# Patient Record
Sex: Female | Born: 1975 | Race: White | Hispanic: No | Marital: Married | Smoking: Never smoker
Health system: Southern US, Community
[De-identification: ages and names within clinical notes are randomized; demographics above are authoritative.]

## PROBLEM LIST (undated history)

## (undated) DIAGNOSIS — O139 Gestational [pregnancy-induced] hypertension without significant proteinuria, unspecified trimester: Secondary | ICD-10-CM

## (undated) HISTORY — DX: Gestational (pregnancy-induced) hypertension without significant proteinuria, unspecified trimester: O13.9

---

## 2009-10-08 ENCOUNTER — Emergency Department (HOSPITAL_BASED_OUTPATIENT_CLINIC_OR_DEPARTMENT_OTHER): Admission: EM | Admit: 2009-10-08 | Discharge: 2009-10-09 | Payer: Self-pay | Admitting: Emergency Medicine

## 2011-01-15 LAB — COMPREHENSIVE METABOLIC PANEL
Alkaline Phosphatase: 52 U/L (ref 39–117)
BUN: 7 mg/dL (ref 6–23)
Calcium: 8.8 mg/dL (ref 8.4–10.5)
Creatinine, Ser: 0.7 mg/dL (ref 0.4–1.2)
Glucose, Bld: 96 mg/dL (ref 70–99)
Total Protein: 7.2 g/dL (ref 6.0–8.3)

## 2011-01-15 LAB — CLOSTRIDIUM DIFFICILE EIA: C difficile Toxins A+B, EIA: NEGATIVE

## 2011-01-15 LAB — CBC
HCT: 39.8 % (ref 36.0–46.0)
Hemoglobin: 13.5 g/dL (ref 12.0–15.0)
MCHC: 33.9 g/dL (ref 30.0–36.0)
MCV: 93.4 fL (ref 78.0–100.0)
RDW: 11.7 % (ref 11.5–15.5)

## 2011-01-15 LAB — URINALYSIS, ROUTINE W REFLEX MICROSCOPIC
Bilirubin Urine: NEGATIVE
Glucose, UA: NEGATIVE mg/dL
Ketones, ur: NEGATIVE mg/dL
Protein, ur: NEGATIVE mg/dL
pH: 5.5 (ref 5.0–8.0)

## 2011-01-15 LAB — DIFFERENTIAL
Basophils Relative: 0 % (ref 0–1)
Lymphocytes Relative: 31 % (ref 12–46)
Lymphs Abs: 1.5 10*3/uL (ref 0.7–4.0)
Monocytes Relative: 14 % — ABNORMAL HIGH (ref 3–12)
Neutro Abs: 2.6 10*3/uL (ref 1.7–7.7)
Neutrophils Relative %: 53 % (ref 43–77)

## 2011-01-15 LAB — OVA AND PARASITE EXAMINATION: Ova and parasites: NONE SEEN

## 2011-01-15 LAB — STOOL CULTURE

## 2011-10-16 DIAGNOSIS — O1490 Unspecified pre-eclampsia, unspecified trimester: Secondary | ICD-10-CM

## 2015-11-10 ENCOUNTER — Ambulatory Visit (INDEPENDENT_AMBULATORY_CARE_PROVIDER_SITE_OTHER): Payer: Managed Care, Other (non HMO) | Admitting: Obstetrics & Gynecology

## 2015-11-10 ENCOUNTER — Encounter: Payer: Self-pay | Admitting: Obstetrics & Gynecology

## 2015-11-10 VITALS — BP 115/69 | HR 68 | Ht 67.0 in | Wt 197.0 lb

## 2015-11-10 DIAGNOSIS — O09291 Supervision of pregnancy with other poor reproductive or obstetric history, first trimester: Secondary | ICD-10-CM

## 2015-11-10 DIAGNOSIS — O09521 Supervision of elderly multigravida, first trimester: Secondary | ICD-10-CM

## 2015-11-10 DIAGNOSIS — O09529 Supervision of elderly multigravida, unspecified trimester: Secondary | ICD-10-CM | POA: Insufficient documentation

## 2015-11-10 DIAGNOSIS — Z36 Encounter for antenatal screening of mother: Secondary | ICD-10-CM

## 2015-11-10 DIAGNOSIS — O09299 Supervision of pregnancy with other poor reproductive or obstetric history, unspecified trimester: Secondary | ICD-10-CM | POA: Insufficient documentation

## 2015-11-10 NOTE — Progress Notes (Signed)
   Subjective:    Savannah Nixon is a [id number] [redacted]w[redacted]d being seen today for her first obstetrical visit.  Her obstetrical history is significant for advanced maternal age, large for gestational age and pre-eclampsia. Patient does intend to breast feed. Pregnancy history fully reviewed.  Patient reports mild nausea.  Filed Vitals:   11/10/15 1321 11/10/15 1324  BP: 115/69   Pulse: 68   Height:   (1.702 m)  Weight: 197 lb (89.359 kg)     HISTORY: OB History  Gravida Para Term Preterm AB SAB TAB Ectopic Multiple Living  # Outcome Date GA Lbr Len/2nd Weight Sex Delivery Anes PTL Lv  4 Current           3 SAB           2 Term  [redacted]w[redacted]d  9 lb 9 oz (4.338 kg)  CS-Classical     1 Preterm  [redacted]w[redacted]d  4 lb 4 oz (1.928 kg)  CS-Classical        Past Medical History  Diagnosis Date  . PIH (pregnancy induced hypertension)    Past Surgical History  Procedure Laterality Date  . Cesarean section     Family History  Problem Relation Age of Onset  . Hypertension Father   . Osteoarthritis Mother   . Atrial fibrillation Father      Exam    Uterus:     Pelvic Exam:    Perineum: No Hemorrhoids   Vulva: normal   Vagina:  normal mucosa, normal discharge   pH: n/a   Cervix: no cervical motion tenderness and no lesions   Adnexa: normal adnexa   Bony Pelvis: gynecoid  System: Breast:  normal appearance, no masses or tenderness   Skin: normal coloration and turgor, no rashes    Neurologic: oriented, normal mood   Extremities: no deformities   HEENT extra ocular movement intact, sclera clear, anicteric, oropharynx clear, no lesions, neck supple with midline trachea and thyroid without masses   Mouth/Teeth mucous membranes moist, pharynx normal without lesions and dental hygiene good   Neck supple and no masses   Cardiovascular: regular rate and rhythm   Respiratory:  appears well, vitals normal, no respiratory distress, acyanotic, normal RR, chest clear, no wheezing,  crepitations, rhonchi, normal symmetric air entry   Abdomen: soft, non-tender; bowel sounds normal; no masses,  no organomegaly   Urinary: urethral meatus normal      Assessment:    Pregnancy: A5W0981 There are no active problems to display for this patient.       Plan:     Initial labs drawn. Prenatal vitamins. Problem list reviewed and updated. Genetic Screening discussed Harmony at next visit  Ultrasound discussed; fetal survey: requested.  Follow up in 4 weeks.  History of severe preeclampsia needing delivery at 34 weeks with first pregnancy and post partum preeclampsia with 2nd pregnancy. Baseline protein creatinine ration and labs Baby ASA at 12 weeks (Please tell pt to take at next visit) Will need rpt c/s and BTL at 39 weeks  Will consider extreme AMA since she is due on her 40th b day. Follow protocol.  Pt reports pap nml 18 months ago with no history of abnml pap.  Will requet records.  RTC 4 weeks  Coreen Shippee H. 11/10/2015

## 2015-11-10 NOTE — Progress Notes (Signed)
Bedside U/S shows IUP with FHT of 166 BPM and CRL is 14.57mm  GA is [redacted]w[redacted]d.  Last pap about 18 months ago, never had an abnormal.  She is Facilities manager in ED @ [employer]

## 2015-11-11 LAB — OBSTETRIC PANEL
Antibody Screen: NEGATIVE
BASOS PCT: 0 % (ref 0–1)
Basophils Absolute: 0 10*3/uL (ref 0.0–0.1)
EOS ABS: 0.1 10*3/uL (ref 0.0–0.7)
Eosinophils Relative: 1 % (ref 0–5)
HEMATOCRIT: 40.4 % (ref 36.0–46.0)
HEMOGLOBIN: 13.2 g/dL (ref 12.0–15.0)
Hepatitis B Surface Ag: NEGATIVE
Lymphocytes Relative: 30 % (ref 12–46)
Lymphs Abs: 3.4 10*3/uL (ref 0.7–4.0)
MCH: 30.1 pg (ref 26.0–34.0)
MCHC: 32.7 g/dL (ref 30.0–36.0)
MCV: 92.2 fL (ref 78.0–100.0)
MONOS PCT: 5 % (ref 3–12)
MPV: 10.2 fL (ref 8.6–12.4)
Monocytes Absolute: 0.6 10*3/uL (ref 0.1–1.0)
NEUTROS ABS: 7.2 10*3/uL (ref 1.7–7.7)
NEUTROS PCT: 64 % (ref 43–77)
Platelets: 318 10*3/uL (ref 150–400)
RBC: 4.38 MIL/uL (ref 3.87–5.11)
RDW: 12.6 % (ref 11.5–15.5)
RH TYPE: POSITIVE
Rubella: 4.25 Index — ABNORMAL HIGH (ref <0.90)
WBC: 11.3 10*3/uL — AB (ref 4.0–10.5)

## 2015-11-11 LAB — COMPREHENSIVE METABOLIC PANEL
ALBUMIN: 4.3 g/dL (ref 3.6–5.1)
ALK PHOS: 43 U/L (ref 33–115)
ALT: 12 U/L (ref 6–29)
AST: 14 U/L (ref 10–30)
BILIRUBIN TOTAL: 0.3 mg/dL (ref 0.2–1.2)
BUN: 10 mg/dL (ref 7–25)
CALCIUM: 9.5 mg/dL (ref 8.6–10.2)
CO2: 22 mmol/L (ref 20–31)
Chloride: 104 mmol/L (ref 98–110)
Creat: 0.71 mg/dL (ref 0.50–1.10)
GLUCOSE: 73 mg/dL (ref 65–99)
POTASSIUM: 3.8 mmol/L (ref 3.5–5.3)
Sodium: 136 mmol/L (ref 135–146)
TOTAL PROTEIN: 6.9 g/dL (ref 6.1–8.1)

## 2015-11-11 LAB — PROTEIN / CREATININE RATIO, URINE
Creatinine, Urine: 50 mg/dL (ref 20–320)
Total Protein, Urine: <4 mg/dL — ABNORMAL LOW (ref 5–24)

## 2015-11-11 LAB — HIV ANTIBODY (ROUTINE TESTING W REFLEX): HIV: NONREACTIVE

## 2015-11-11 LAB — GC/CHLAMYDIA PROBE AMP
CT Probe RNA: NOT DETECTED
GC PROBE AMP APTIMA: NOT DETECTED

## 2015-11-12 LAB — URINE CULTURE

## 2015-12-08 ENCOUNTER — Ambulatory Visit (INDEPENDENT_AMBULATORY_CARE_PROVIDER_SITE_OTHER): Payer: Managed Care, Other (non HMO) | Admitting: Obstetrics & Gynecology

## 2015-12-08 VITALS — BP 120/69 | HR 68 | Wt 201.0 lb

## 2015-12-08 DIAGNOSIS — O09291 Supervision of pregnancy with other poor reproductive or obstetric history, first trimester: Secondary | ICD-10-CM

## 2015-12-08 DIAGNOSIS — Z36 Encounter for antenatal screening of mother: Secondary | ICD-10-CM

## 2015-12-08 DIAGNOSIS — O09521 Supervision of elderly multigravida, first trimester: Secondary | ICD-10-CM

## 2015-12-08 DIAGNOSIS — O34219 Maternal care for unspecified type scar from previous cesarean delivery: Secondary | ICD-10-CM

## 2015-12-08 MED ORDER — ASPIRIN 81 MG PO CHEW: 81.0000 mg | CHEWABLE_TABLET | Freq: Every day | ORAL | Status: DC

## 2015-12-08 NOTE — Progress Notes (Signed)
Subjective:  Savannah Nixon is a 40 y.o. MW [id number] at [redacted]w[redacted]d being seen today for ongoing prenatal care.  She is currently monitored for the following issues for this high-risk pregnancy and has AMA (advanced maternal age) multigravida 35+; Supervision of elderly multigravida, antepartum; History of pre-eclampsia in prior pregnancy, currently pregnant; and Previous cesarean delivery, antepartum condition or complication on her problem list.  Patient reports no complaints.  Contractions: Not present. Vag. Bleeding: None.  Movement: Absent. Denies leaking of fluid.   The following portions of the patient's history were reviewed and updated as appropriate: allergies, current medications, past family history, past medical history, past social history, past surgical history and problem list. Problem list updated.  Objective:   Filed Vitals:   12/08/15 1600  BP: 120/69  Pulse: 68  Weight: 201 lb (91.173 kg)    Fetal Status:     Movement: Absent     General:  Alert, oriented and cooperative. Patient is in no acute distress.  Skin: Skin is warm and dry. No rash noted.   Cardiovascular: Normal heart rate noted  Respiratory: Normal respiratory effort, no problems with respiration noted  Abdomen: Soft, gravid, appropriate for gestational age. Pain/Pressure: Absent     Pelvic: Vag. Bleeding: None Vag D/C Character: Thin   Cervical exam deferred        Extremities: Normal range of motion.     Mental Status: Normal mood and affect. Normal behavior. Normal judgment and thought content.   Urinalysis:      Assessment and Plan:  Pregnancy: A5W0981 at [redacted]w[redacted]d  1. AMA (advanced maternal age) multigravida 35+, first trimester  - Genetic Screening/Harmony  2. Supervision of elderly multigravida, antepartum, first trimester   3. History of pre-eclampsia in prior pregnancy, currently pregnant, first trimester - Start asa 81 mg now  4. Previous cesarean delivery, antepartum condition or  complication - Plan for repeat and BTL  Preterm labor symptoms and general obstetric precautions including but not limited to vaginal bleeding, contractions, leaking of fluid and fetal movement were reviewed in detail with the patient. Please refer to After Visit Summary for other counseling recommendations.  Return in about 4 weeks (around 01/05/2016).   Allie Bossier, MD

## 2015-12-15 ENCOUNTER — Encounter: Payer: Managed Care, Other (non HMO) | Admitting: Obstetrics & Gynecology

## 2016-01-05 ENCOUNTER — Ambulatory Visit (INDEPENDENT_AMBULATORY_CARE_PROVIDER_SITE_OTHER): Payer: Managed Care, Other (non HMO) | Admitting: Obstetrics & Gynecology

## 2016-01-05 ENCOUNTER — Encounter: Payer: Self-pay | Admitting: *Deleted

## 2016-01-05 VITALS — BP 112/65 | HR 68 | Wt 211.0 lb

## 2016-01-05 DIAGNOSIS — O09521 Supervision of elderly multigravida, first trimester: Secondary | ICD-10-CM

## 2016-01-05 DIAGNOSIS — Z36 Encounter for antenatal screening of mother: Secondary | ICD-10-CM

## 2016-01-05 DIAGNOSIS — O09522 Supervision of elderly multigravida, second trimester: Secondary | ICD-10-CM

## 2016-01-05 MED ORDER — TRICARE PRENATAL DHA ONE 27-1-500 MG PO CAPS: 1.0000 | ORAL_CAPSULE | Freq: Every day | ORAL | Status: DC

## 2016-01-05 NOTE — Progress Notes (Signed)
Subjective:  Savannah Nixon is a 40 y.o. [id number] at 7545w5d being seen today for ongoing prenatal care.  She is currently monitored for the following issues for this high-risk pregnancy and has AMA (advanced maternal age) multigravida 35+; Supervision of elderly multigravida, antepartum; History of pre-eclampsia in prior pregnancy, currently pregnant; and Previous cesarean delivery, antepartum condition or complication on her problem list.  Patient reports pulling in the anterior abdominal wallnear center.  Contractions: Not present. Vag. Bleeding: None.  Movement: Absent. Denies leaking of fluid.   The following portions of the patient's history were reviewed and updated as appropriate: allergies, current medications, past family history, past medical history, past social history, past surgical history and problem list. Problem list updated.  Objective:   Filed Vitals:   01/05/16 1510  BP: 112/65  Pulse: 68  Weight: 211 lb (95.709 kg)    Fetal Status: Fetal Heart Rate (bpm): 144   Movement: Absent     General:  Alert, oriented and cooperative. Patient is in no acute distress.  Skin: Skin is warm and dry. No rash noted.   Cardiovascular: Normal heart rate noted  Respiratory: Normal respiratory effort, no problems with respiration noted  Abdomen: Soft, gravid, appropriate for gestational age. Pain/Pressure: Absent     Pelvic: Vag. Bleeding: None Vag D/C Character: Thin   Cervical exam deferred        Extremities: Normal range of motion.  Edema: None  Mental Status: Normal mood and affect. Normal behavior. Normal judgment and thought content.   Urinalysis: Urine Protein: Negative Urine Glucose: Negative  Assessment and Plan:  Pregnancy: [id number] at 3645w5d  1. Advanced maternal age in multigravida, second trimester - US MFM OB DETAIL +14 WK; Future - Alpha fetoprotein, maternal -Need to find Harmony--pt was called and said it was normal, but I can't find in Epic.  RN Dimas AguasHoward aware and  looking.  Preterm labor symptoms and general obstetric precautions including but not limited to vaginal bleeding, contractions, leaking of fluid and fetal movement were reviewed in detail with the patient. Please refer to After Visit Summary for other counseling recommendations.   Abdominal pain most likely musculature.  No change in bowel habits, pelvic pressure, no vaginal discharge or bleeding.  Pt denies fever, N/V/D. Return in about 4 weeks (around 02/02/2016).   Lesly DukesKelly H Brennon Otterness, MD

## 2016-01-10 LAB — ALPHA FETOPROTEIN, MATERNAL
AFP: 28.6 ng/mL
Curr Gest Age: 15.5 wks.days
MoM for AFP: 1.16
OPEN SPINA BIFIDA: NEGATIVE
Osb Risk: 1:7770 {titer}

## 2016-02-01 ENCOUNTER — Ambulatory Visit (HOSPITAL_COMMUNITY): Payer: Managed Care, Other (non HMO)

## 2016-02-02 ENCOUNTER — Ambulatory Visit (HOSPITAL_COMMUNITY)
Admission: RE | Admit: 2016-02-02 | Discharge: 2016-02-02 | Disposition: A | Discharge status: Home / Self Care | Payer: Managed Care, Other (non HMO) | Source: Ambulatory Visit | Attending: Obstetrics & Gynecology | Admitting: Obstetrics & Gynecology

## 2016-02-02 ENCOUNTER — Other Ambulatory Visit: Payer: Self-pay | Admitting: Obstetrics & Gynecology

## 2016-02-02 ENCOUNTER — Other Ambulatory Visit (HOSPITAL_COMMUNITY): Payer: Self-pay | Admitting: Maternal and Fetal Medicine

## 2016-02-02 ENCOUNTER — Ambulatory Visit (INDEPENDENT_AMBULATORY_CARE_PROVIDER_SITE_OTHER): Payer: Managed Care, Other (non HMO) | Admitting: Obstetrics & Gynecology

## 2016-02-02 VITALS — BP 120/62 | HR 76 | Wt 221.0 lb

## 2016-02-02 DIAGNOSIS — O34219 Maternal care for unspecified type scar from previous cesarean delivery: Secondary | ICD-10-CM | POA: Diagnosis not present

## 2016-02-02 DIAGNOSIS — O09292 Supervision of pregnancy with other poor reproductive or obstetric history, second trimester: Secondary | ICD-10-CM

## 2016-02-02 DIAGNOSIS — Z36 Encounter for antenatal screening of mother: Secondary | ICD-10-CM | POA: Insufficient documentation

## 2016-02-02 DIAGNOSIS — O09522 Supervision of elderly multigravida, second trimester: Secondary | ICD-10-CM | POA: Insufficient documentation

## 2016-02-02 DIAGNOSIS — Z3A19 19 weeks gestation of pregnancy: Secondary | ICD-10-CM | POA: Insufficient documentation

## 2016-02-02 DIAGNOSIS — O359XX0 Maternal care for (suspected) fetal abnormality and damage, unspecified, not applicable or unspecified: Secondary | ICD-10-CM

## 2016-02-02 DIAGNOSIS — Z1389 Encounter for screening for other disorder: Secondary | ICD-10-CM

## 2016-02-02 DIAGNOSIS — Z3A20 20 weeks gestation of pregnancy: Secondary | ICD-10-CM

## 2016-02-02 NOTE — Progress Notes (Signed)
Subjective:  Savannah Nixon is a 40 y.o. MW [id number] at 8785w5d being seen today for ongoing prenatal care.  She is currently monitored for the following issues for this high-risk pregnancy and has AMA (advanced maternal age) multigravida 35+; Supervision of elderly multigravida, antepartum; History of pre-eclampsia in prior pregnancy, currently pregnant; and Previous cesarean delivery, antepartum condition or complication on her problem list.  Patient reports no complaints.  Contractions: Not present. Vag. Bleeding: None.  Movement: Present. Denies leaking of fluid.   The following portions of the patient's history were reviewed and updated as appropriate: allergies, current medications, past family history, past medical history, past social history, past surgical history and problem list. Problem list updated.  Objective:   Filed Vitals:   02/02/16 1443  BP: 120/62  Pulse: 76  Weight: 221 lb (100.245 kg)    Fetal Status:     Movement: Present     General:  Alert, oriented and cooperative. Patient is in no acute distress.  Skin: Skin is warm and dry. No rash noted.   Cardiovascular: Normal heart rate noted  Respiratory: Normal respiratory effort, no problems with respiration noted  Abdomen: Soft, gravid, appropriate for gestational age. Pain/Pressure: Absent     Pelvic: Vag. Bleeding: None Vag D/C Character: Thick   Cervical exam deferred        Extremities: Normal range of motion.  Edema: None  Mental Status: Normal mood and affect. Normal behavior. Normal judgment and thought content.   Urinalysis: Urine Protein: Negative Urine Glucose: Negative  Assessment and Plan:  Pregnancy: [id number] at 785w5d  1. Supervision of elderly multigravida, antepartum, second trimester - Her anatomy u/s is today. - She will get an early 1 hour glucola (blood [facility]  2. History of pre-eclampsia in prior pregnancy, currently pregnant, second  trimester   Preterm labor symptoms and general obstetric precautions including but not limited to vaginal bleeding, contractions, leaking of fluid and fetal movement were reviewed in detail with the patient. Please refer to After Visit Summary for other counseling recommendations.  Return in about 4 weeks (around 03/01/2016).   Allie BossierMyra C Gayle Collard, MD

## 2016-02-02 NOTE — Progress Notes (Signed)
C/O's

## 2016-02-08 ENCOUNTER — Encounter: Payer: Self-pay | Admitting: *Deleted

## 2016-02-08 DIAGNOSIS — O09522 Supervision of elderly multigravida, second trimester: Secondary | ICD-10-CM

## 2016-02-09 ENCOUNTER — Ambulatory Visit (HOSPITAL_COMMUNITY)
Admission: RE | Admit: 2016-02-09 | Discharge: 2016-02-09 | Disposition: A | Discharge status: Home / Self Care | Payer: Managed Care, Other (non HMO) | Source: Ambulatory Visit | Attending: Obstetrics & Gynecology | Admitting: Obstetrics & Gynecology

## 2016-02-09 ENCOUNTER — Encounter (HOSPITAL_COMMUNITY): Payer: Self-pay

## 2016-02-09 VITALS — BP 116/52 | HR 69 | Wt 224.0 lb

## 2016-02-09 DIAGNOSIS — O09299 Supervision of pregnancy with other poor reproductive or obstetric history, unspecified trimester: Secondary | ICD-10-CM | POA: Diagnosis not present

## 2016-02-09 DIAGNOSIS — O09292 Supervision of pregnancy with other poor reproductive or obstetric history, second trimester: Secondary | ICD-10-CM

## 2016-02-09 DIAGNOSIS — Z36 Encounter for antenatal screening of mother: Secondary | ICD-10-CM | POA: Diagnosis not present

## 2016-02-09 DIAGNOSIS — O09522 Supervision of elderly multigravida, second trimester: Secondary | ICD-10-CM | POA: Diagnosis present

## 2016-02-09 DIAGNOSIS — Z3A20 20 weeks gestation of pregnancy: Secondary | ICD-10-CM | POA: Diagnosis not present

## 2016-02-09 DIAGNOSIS — O09521 Supervision of elderly multigravida, first trimester: Secondary | ICD-10-CM

## 2016-02-09 DIAGNOSIS — O359XX0 Maternal care for (suspected) fetal abnormality and damage, unspecified, not applicable or unspecified: Secondary | ICD-10-CM

## 2016-02-09 DIAGNOSIS — O34219 Maternal care for unspecified type scar from previous cesarean delivery: Secondary | ICD-10-CM

## 2016-03-01 ENCOUNTER — Ambulatory Visit (INDEPENDENT_AMBULATORY_CARE_PROVIDER_SITE_OTHER): Payer: Managed Care, Other (non HMO) | Admitting: Obstetrics & Gynecology

## 2016-03-01 VITALS — BP 139/79 | HR 68 | Wt 232.0 lb

## 2016-03-01 DIAGNOSIS — O34219 Maternal care for unspecified type scar from previous cesarean delivery: Secondary | ICD-10-CM

## 2016-03-01 DIAGNOSIS — O09292 Supervision of pregnancy with other poor reproductive or obstetric history, second trimester: Secondary | ICD-10-CM

## 2016-03-01 DIAGNOSIS — O09522 Supervision of elderly multigravida, second trimester: Secondary | ICD-10-CM

## 2016-03-01 NOTE — Progress Notes (Signed)
Subjective:  Zebedee IbaKaren R Six is a 40 y.o. [id number] at 2933w5d being seen today for ongoing prenatal care.  She is currently monitored for the following issues for this high-risk pregnancy and has AMA (advanced maternal age) multigravida 35+; Supervision of elderly multigravida, antepartum; History of pre-eclampsia in prior pregnancy, currently pregnant; and Previous cesarean delivery, antepartum condition or complication on her problem list.  Patient reports no complaints.  Contractions: Not present. Vag. Bleeding: None.  Movement: Present. Denies leaking of fluid.   The following portions of the patient's history were reviewed and updated as appropriate: allergies, current medications, past family history, past medical history, past social history, past surgical history and problem list. Problem list updated.  Objective:   Filed Vitals:   03/01/16 1558  BP: 139/79  Pulse: 68  Weight: 232 lb (105.235 kg)    Fetal Status: Fetal Heart Rate (bpm): 140   Movement: Present     General:  Alert, oriented and cooperative. Patient is in no acute distress.  Skin: Skin is warm and dry. No rash noted.   Cardiovascular: Normal heart rate noted  Respiratory: Normal respiratory effort, no problems with respiration noted  Abdomen: Soft, gravid, appropriate for gestational age. Pain/Pressure: Absent     Pelvic: Vag. Bleeding: None Vag D/C Character: Thin   Cervical exam deferred        Extremities: Normal range of motion.  Edema: None  Mental Status: Normal mood and affect. Normal behavior. Normal judgment and thought content.   Urinalysis: Urine Protein: Negative Urine Glucose: Negative  Assessment and Plan:  Pregnancy: [id number] at 3133w5d  1. Supervision of elderly multigravida, antepartum, second trimester - She will [facility] gluc[facility] ([facility] on [address]   2. Previous cesarean delivery, antepartum condition or complication - for RLTC and BTL  3. History of pre-eclampsia in prior  pregnancy, currently pregnant, second trimester - Taking baby asa daily, rec'd no more weight gain in this pregnancy  Preterm labor symptoms and general obstetric precautions including but not limited to vaginal bleeding, contractions, leaking of fluid and fetal movement were reviewed in detail with the patient. Please refer to After Visit Summary for other counseling recommendations.  Return in about 4 weeks (around 03/29/2016).   Allie BossierMyra C Nieves Barberi, MD

## 2016-03-27 ENCOUNTER — Encounter: Payer: Self-pay | Admitting: *Deleted

## 2016-03-27 DIAGNOSIS — O09522 Supervision of elderly multigravida, second trimester: Secondary | ICD-10-CM

## 2016-03-29 ENCOUNTER — Ambulatory Visit (INDEPENDENT_AMBULATORY_CARE_PROVIDER_SITE_OTHER): Payer: Managed Care, Other (non HMO) | Admitting: Obstetrics & Gynecology

## 2016-03-29 VITALS — BP 112/67 | HR 79 | Wt 236.0 lb

## 2016-03-29 DIAGNOSIS — O09292 Supervision of pregnancy with other poor reproductive or obstetric history, second trimester: Secondary | ICD-10-CM

## 2016-03-29 DIAGNOSIS — O09522 Supervision of elderly multigravida, second trimester: Secondary | ICD-10-CM

## 2016-03-29 DIAGNOSIS — Z36 Encounter for antenatal screening of mother: Secondary | ICD-10-CM | POA: Diagnosis not present

## 2016-03-29 DIAGNOSIS — O34219 Maternal care for unspecified type scar from previous cesarean delivery: Secondary | ICD-10-CM

## 2016-03-29 NOTE — Progress Notes (Signed)
Subjective:  Savannah Nixon is a 40 y.o. [id number] at 6197w5d being seen today for ongoing prenatal care.  She is currently monitored for the following issues for this low-risk pregnancy and has AMA (advanced maternal age) multigravida 35+; Supervision of elderly multigravida, antepartum; History of pre-eclampsia in prior pregnancy, currently pregnant; and Previous cesarean delivery, antepartum condition or complication on her problem list.  Patient reports no complaints. She is seeing Dr. Josie Saunderselp for chiro and back is feeling much better.  Contractions: Not present. Vag. Bleeding: None.  Movement: Present. Denies leaking of fluid.   The following portions of the patient's history were reviewed and updated as appropriate: allergies, current medications, past family history, past medical history, past social history, past surgical history and problem list. Problem list updated.  Objective:   Filed Vitals:   03/29/16 1614  BP: 112/67  Pulse: 79  Weight: 236 lb (107.049 kg)    Fetal Status:     Movement: Present     General:  Alert, oriented and cooperative. Patient is in no acute distress.  Skin: Skin is warm and dry. No rash noted.   Cardiovascular: Normal heart rate noted  Respiratory: Normal respiratory effort, no problems with respiration noted  Abdomen: Soft, gravid, appropriate for gestational age. Pain/Pressure: Present     Pelvic: Cervical exam deferred        Extremities: Normal range of motion.  Edema: None  Mental Status: Normal mood and affect. Normal behavior. Normal judgment and thought content.   Urinalysis: Urine Protein: Negative Urine Glucose: Negative  Assessment and Plan:  Pregnancy: [id number] at 6997w5d  1. Supervision of elderly multigravida, antepartum, second trimester  - HIV antibody - RPR - CBC  2. Previous cesarean delivery, antepartum condition or complication   3. History of pre-eclampsia in prior pregnancy, currently pregnant, second trimester   4. AMA  (advanced maternal age) multigravida 35+, second trimester   Preterm labor symptoms and general obstetric precautions including but not limited to vaginal bleeding, contractions, leaking of fluid and fetal movement were reviewed in detail with the patient. Please refer to After Visit Summary for other counseling recommendations.  Return in about 3 weeks (around 04/19/2016) for TDAP at next visit.   Allie BossierMyra C Willman Cuny, MD

## 2016-03-30 LAB — HIV ANTIBODY (ROUTINE TESTING W REFLEX): HIV 1&2 Ab, 4th Generation: NONREACTIVE

## 2016-03-30 LAB — CBC
HEMATOCRIT: 33.8 % — AB (ref 35.0–45.0)
HEMOGLOBIN: 11.2 g/dL — AB (ref 11.7–15.5)
MCH: 30.7 pg (ref 27.0–33.0)
MCHC: 33.1 g/dL (ref 32.0–36.0)
MCV: 92.6 fL (ref 80.0–100.0)
MPV: 9.6 fL (ref 7.5–12.5)
Platelets: 238 10*3/uL (ref 140–400)
RBC: 3.65 MIL/uL — AB (ref 3.80–5.10)
RDW: 13 % (ref 11.0–15.0)
WBC: 10.8 10*3/uL (ref 3.8–10.8)

## 2016-03-30 LAB — RPR

## 2016-04-04 ENCOUNTER — Telehealth: Payer: Self-pay | Admitting: *Deleted

## 2016-04-04 DIAGNOSIS — O09513 Supervision of elderly primigravida, third trimester: Secondary | ICD-10-CM

## 2016-04-04 MED ORDER — MISC. DEVICES KIT: PACK | Status: AC

## 2016-04-04 NOTE — Telephone Encounter (Signed)
Written RX for Spectra S2 Double breast pump faxed to [phone]

## 2016-04-19 ENCOUNTER — Encounter: Payer: Self-pay | Admitting: Obstetrics & Gynecology

## 2016-04-19 ENCOUNTER — Ambulatory Visit (INDEPENDENT_AMBULATORY_CARE_PROVIDER_SITE_OTHER): Payer: BC Managed Care – PPO | Admitting: Obstetrics & Gynecology

## 2016-04-19 VITALS — BP 118/69 | HR 71 | Wt 239.0 lb

## 2016-04-19 DIAGNOSIS — O09523 Supervision of elderly multigravida, third trimester: Secondary | ICD-10-CM

## 2016-04-19 DIAGNOSIS — O09293 Supervision of pregnancy with other poor reproductive or obstetric history, third trimester: Secondary | ICD-10-CM

## 2016-04-19 DIAGNOSIS — O34219 Maternal care for unspecified type scar from previous cesarean delivery: Secondary | ICD-10-CM

## 2016-04-19 NOTE — Progress Notes (Signed)
Subjective:  Savannah Nixon is a 40 y.o. [id number] at 2187w5d being seen today for ongoing prenatal care.  Savannah Nixon is currently monitored for the following issues for this high-risk pregnancy and has AMA (advanced maternal age) multigravida 35+; Supervision of elderly multigravida, antepartum; History of pre-eclampsia in prior pregnancy, currently pregnant; and Previous cesarean delivery, antepartum condition or complication on her problem list.  Savannah Nixon reports Pt has had loose stools and sl nausea for a couple of days.  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  Contractions: Not present. Vag. Bleeding: None.  Movement: Present. Denies leaking of fluid.   The following portions of the Savannah Nixon's history were reviewed and updated as appropriate: allergies, current medications, past family history, past medical history, past social history, past surgical history and problem list. Problem list updated.  Objective:   Filed Vitals:   04/19/16 1555  BP: 118/69  Pulse: 71  Weight: 239 lb (108.41 kg)    Fetal Status: Fetal Heart Rate (bpm): 132   Movement: Present     General:  Alert, oriented and cooperative. Savannah Nixon is in no acute distress.  Skin: Skin is warm and dry. No rash noted.   Cardiovascular: Normal heart rate noted  Respiratory: Normal respiratory effort, no problems with respiration noted  Abdomen: Soft, gravid, appropriate for gestational age. Pain/Pressure: Absent     Pelvic:  Cervical exam deferred        Extremities: Normal range of motion.  Edema: None  Mental Status: Normal mood and affect. Normal behavior. Normal judgment and thought content.   Urinalysis: Urine Protein: Negative Urine Glucose: Negative  Assessment and Plan:  Pregnancy: [id number] at 7887w5d  1. Supervision of elderly multigravida, antepartum, third trimester Keep well hydrated Needs Tdap at next visit  (pt to be called by nursing to inform her)  2. History of pre-eclampsia in prior pregnancy, currently pregnant, third trimester On  baby ASA   3. Previous cesarean delivery, antepartum condition or complication Repeat to be scheduled at 39 weeks [facility]  4. AMA (advanced maternal age) multigravida 35+, third trimester Del at 4439 weeks  Preterm labor symptoms and general obstetric precautions including but not limited to vaginal bleeding, contractions, leaking of fluid and fetal movement were reviewed in detail with the Savannah Nixon. Please refer to After Visit Summary for other counseling recommendations.  No Follow-up on file.   Willodean Rosenthalarolyn Harraway-Smith, MD

## 2016-04-20 ENCOUNTER — Encounter (HOSPITAL_COMMUNITY): Payer: Self-pay | Admitting: *Deleted

## 2016-04-20 ENCOUNTER — Other Ambulatory Visit: Payer: Self-pay | Admitting: Obstetrics & Gynecology

## 2016-05-03 ENCOUNTER — Ambulatory Visit (INDEPENDENT_AMBULATORY_CARE_PROVIDER_SITE_OTHER): Payer: BC Managed Care – PPO | Admitting: Obstetrics & Gynecology

## 2016-05-03 VITALS — BP 112/66 | HR 76 | Wt 242.0 lb

## 2016-05-03 DIAGNOSIS — Z23 Encounter for immunization: Secondary | ICD-10-CM

## 2016-05-03 DIAGNOSIS — O09523 Supervision of elderly multigravida, third trimester: Secondary | ICD-10-CM

## 2016-05-03 DIAGNOSIS — Z3483 Encounter for supervision of other normal pregnancy, third trimester: Secondary | ICD-10-CM

## 2016-05-04 NOTE — Progress Notes (Signed)
Subjective:  Savannah Nixon is a 40 y.o. [id number] at 1057w6d being seen today for ongoing prenatal care.  She is currently monitored for the following issues for this high-risk pregnancy and has AMA (advanced maternal age) multigravida 35+; Supervision of elderly multigravida, antepartum; History of pre-eclampsia in prior pregnancy, currently pregnant; and Previous cesarean delivery, antepartum condition or complication on her problem list.  Patient reports no complaints.  Contractions: Not present. Vag. Bleeding: None.  Movement: Present. Denies leaking of fluid.   The following portions of the patient's history were reviewed and updated as appropriate: allergies, current medications, past family history, past medical history, past social history, past surgical history and problem list. Problem list updated.  Objective:   Filed Vitals:   05/03/16 1606  BP: 112/66  Pulse: 76  Weight: 242 lb (109.77 kg)    Fetal Status: Fetal Heart Rate (bpm): 150 Fundal Height: 36 cm Movement: Present     General:  Alert, oriented and cooperative. Patient is in no acute distress.  Skin: Skin is warm and dry. No rash noted.   Cardiovascular: Normal heart rate noted  Respiratory: Normal respiratory effort, no problems with respiration noted  Abdomen: Soft, gravid, appropriate for gestational age. Pain/Pressure: Absent     Pelvic:  Cervical exam deferred        Extremities: Normal range of motion.  Edema: None  Mental Status: Normal mood and affect. Normal behavior. Normal judgment and thought content.   Urinalysis: Urine Protein: Negative Urine Glucose: Negative  Assessment and Plan:  Pregnancy: [id number] at 4557w6d  1. Normal pregnancy in multigravida in third trimester - Tdap vaccine greater than or equal to 7yo IM  2. Supervision of elderly multigravida, antepartum, third trimester -MFM recommends growth US due to AMA.  Pt has high deductible insurance and would NOT like to have US done at hospital.  Dr.  Chipper HerbMeisseingers's office not comfortable doing limited US only.  Will see if pt wants to go to health dept.    Preterm labor symptoms and general obstetric precautions including but not limited to vaginal bleeding, contractions, leaking of fluid and fetal movement were reviewed in detail with the patient. Please refer to After Visit Summary for other counseling recommendations.  No Follow-up on file.   Lesly DukesKelly H Milliana Reddoch, MD

## 2016-05-15 ENCOUNTER — Ambulatory Visit (INDEPENDENT_AMBULATORY_CARE_PROVIDER_SITE_OTHER): Payer: BC Managed Care – PPO | Admitting: Obstetrics & Gynecology

## 2016-05-15 VITALS — BP 121/69 | HR 79 | Wt 245.0 lb

## 2016-05-15 DIAGNOSIS — O09513 Supervision of elderly primigravida, third trimester: Secondary | ICD-10-CM

## 2016-05-15 DIAGNOSIS — O09523 Supervision of elderly multigravida, third trimester: Secondary | ICD-10-CM

## 2016-05-15 NOTE — Progress Notes (Signed)
Subjective:  Savannah Nixon is a 40 y.o. [id number] at [redacted]w[redacted]d being seen today for ongoing prenatal care.  She is currently monitored for the following issues for this high-risk pregnancy and has AMA (advanced maternal age) multigravida 35+; Supervision of elderly multigravida, antepartum; History of pre-eclampsia in prior pregnancy, currently pregnant; and Previous cesarean delivery, antepartum condition or complication on her problem list.  Patient reports cough for 3 weeks.  Contractions: Not present. Vag. Bleeding: None.  Movement: Present. Denies leaking of fluid.   The following portions of the patient's history were reviewed and updated as appropriate: allergies, current medications, past family history, past medical history, past social history, past surgical history and problem list. Problem list updated.  Objective:   Vitals:   05/15/16 1556  BP: 121/69  Pulse: 79  Weight: 245 lb (111.1 kg)    Fetal Status: Fetal Heart Rate (bpm): 137 Fundal Height: 37 cm Movement: Present     General:  Alert, oriented and cooperative. Patient is in no acute distress.  Skin: Skin is warm and dry. No rash noted.   Cardiovascular: Normal heart rate noted  Respiratory: Normal respiratory effort, no problems with respiration noted  Abdomen: Soft, gravid, appropriate for gestational age. Pain/Pressure: Absent     Pelvic:  Cervical exam deferred        Extremities: Normal range of motion.  Edema: Trace  Mental Status: Normal mood and affect. Normal behavior. Normal judgment and thought content.   Urinalysis: Urine Protein: Negative Urine Glucose: Negative  Assessment and Plan:  Pregnancy: [name] at [redacted]w[redacted]d  1. Advanced maternal age, primigravida in third trimester, antepartum -Pt agrees to get [place] now.  She will return to MFM for this scan.  Size > dates.  CBG is 110 after Coke today. No GDM. - [place] MFM OB FOLLOW UP; Future  2.  Cough -Likely related to allergies.  Encourged to take OTC Zyrtec.  3.   Pt would like to see Dr. Debroah Loop once before she delivers.  Will arrange for her to have an appt with him.  Pt also would like to have photographer in c/s.  This has been cleared with hospital administration and Dr. Lilli Light of Anesthesia.  Preterm labor symptoms and general obstetric precautions including but not limited to vaginal bleeding, contractions, leaking of fluid and fetal movement were reviewed in detail with the patient. Please refer to After Visit Summary for other counseling recommendations.  Return in about 2 weeks (around 05/29/2016).   Savannah Dukes, MD

## 2016-05-22 ENCOUNTER — Encounter: Payer: BC Managed Care – PPO | Admitting: Obstetrics & Gynecology

## 2016-05-29 ENCOUNTER — Encounter: Payer: BC Managed Care – PPO | Admitting: Obstetrics & Gynecology

## 2016-05-30 ENCOUNTER — Encounter (HOSPITAL_COMMUNITY): Payer: Self-pay

## 2016-05-30 ENCOUNTER — Ambulatory Visit (HOSPITAL_COMMUNITY)
Admission: RE | Admit: 2016-05-30 | Discharge: 2016-05-30 | Disposition: A | Discharge status: Home / Self Care | Payer: BC Managed Care – PPO | Source: Ambulatory Visit | Attending: Obstetrics & Gynecology | Admitting: Obstetrics & Gynecology

## 2016-05-30 ENCOUNTER — Ambulatory Visit (INDEPENDENT_AMBULATORY_CARE_PROVIDER_SITE_OTHER): Payer: BC Managed Care – PPO | Admitting: Obstetrics & Gynecology

## 2016-05-30 VITALS — BP 135/72 | HR 77 | Wt 248.0 lb

## 2016-05-30 DIAGNOSIS — O09523 Supervision of elderly multigravida, third trimester: Secondary | ICD-10-CM | POA: Diagnosis not present

## 2016-05-30 DIAGNOSIS — O09513 Supervision of elderly primigravida, third trimester: Secondary | ICD-10-CM | POA: Insufficient documentation

## 2016-05-30 DIAGNOSIS — O34219 Maternal care for unspecified type scar from previous cesarean delivery: Secondary | ICD-10-CM

## 2016-05-30 DIAGNOSIS — Z3A00 Weeks of gestation of pregnancy not specified: Secondary | ICD-10-CM | POA: Insufficient documentation

## 2016-05-30 NOTE — Progress Notes (Signed)
Subjective:  Savannah Nixon is a 40 y.o. [id number] at 293w4d being seen today for ongoing prenatal care.  She is currently monitored for the following issues for this low-risk pregnancy and has AMA (advanced maternal age) multigravida 35+; Supervision of elderly multigravida, antepartum; History of pre-eclampsia in prior pregnancy, currently pregnant; and Previous cesarean delivery, antepartum condition or complication on her problem list.  Patient reports no complaints.  Contractions: Not present. Vag. Bleeding: None.  Movement: Present. Denies leaking of fluid.   The following portions of the patient's history were reviewed and updated as appropriate: allergies, current medications, past family history, past medical history, past social history, past surgical history and problem list. Problem list updated.  Objective:   Vitals:   05/30/16 1057  BP: 135/72  Pulse: 77  Weight: 248 lb (112.5 kg)    Fetal Status:     Movement: Present     General:  Alert, oriented and cooperative. Patient is in no acute distress.  Skin: Skin is warm and dry. No rash noted.   Cardiovascular: Normal heart rate noted  Respiratory: Normal respiratory effort, no problems with respiration noted  Abdomen: Soft, gravid, appropriate for gestational age. Pain/Pressure: Absent     Pelvic:  Cervical exam deferred        Extremities: Normal range of motion.  Edema: Trace  Mental Status: Normal mood and affect. Normal behavior. Normal judgment and thought content.   Urinalysis: Urine Protein: Trace Urine Glucose: Negative  Assessment and Plan:  Pregnancy: [id number] at 373w4d  1. Advanced maternal age in multigravida, third trimester  - Culture, beta strep (group b only) - Urine cytology ancillary only  2. AMA (advanced maternal age) multigravida 35+, third trimester   3. Previous cesarean delivery, antepartum condition or complication - RLTCS and BTL with Dr. Debroah LoopArnold at 39 weeks  4. Supervision of elderly  multigravida, antepartum, third trimester   Preterm labor symptoms and general obstetric precautions including but not limited to vaginal bleeding, contractions, leaking of fluid and fetal movement were reviewed in detail with the patient. Please refer to After Visit Summary for other counseling recommendations.  No Follow-up on file.   Allie BossierMyra C Fordyce Lepak, MD

## 2016-05-31 ENCOUNTER — Telehealth: Payer: Self-pay | Admitting: *Deleted

## 2016-05-31 LAB — URINE CYTOLOGY ANCILLARY ONLY
Chlamydia: NEGATIVE
Neisseria Gonorrhea: NEGATIVE

## 2016-05-31 LAB — CULTURE, BETA STREP (GROUP B ONLY)

## 2016-05-31 NOTE — Telephone Encounter (Signed)
Pt notified of U/S results and repeat scan @ 39 weeks.

## 2016-05-31 NOTE — Telephone Encounter (Signed)
-----   Message from Lesly DukesKelly H Leggett, MD sent at 05/31/2016  3:29 PM EDT ----- Growth >90% at 8 pounds 1 ounce.  Pt scheduled for rpt c/s at 39 weeks.  RN to call with results.

## 2016-06-06 LAB — OB RESULTS CONSOLE GBS: GBS: POSITIVE

## 2016-06-07 ENCOUNTER — Ambulatory Visit (INDEPENDENT_AMBULATORY_CARE_PROVIDER_SITE_OTHER): Payer: BC Managed Care – PPO | Admitting: Obstetrics & Gynecology

## 2016-06-07 ENCOUNTER — Telehealth (HOSPITAL_COMMUNITY): Payer: Self-pay | Admitting: *Deleted

## 2016-06-07 VITALS — BP 131/76 | HR 72 | Wt 253.0 lb

## 2016-06-07 DIAGNOSIS — O09523 Supervision of elderly multigravida, third trimester: Secondary | ICD-10-CM

## 2016-06-07 DIAGNOSIS — Z3483 Encounter for supervision of other normal pregnancy, third trimester: Secondary | ICD-10-CM

## 2016-06-07 NOTE — Telephone Encounter (Signed)
Preadmission screen  

## 2016-06-07 NOTE — Progress Notes (Signed)
Subjective:  Zebedee IbaKaren R Ollis is a 40 y.o. [id number] at 8690w5d being seen today for ongoing prenatal care.  She is currently monitored for the following issues for this high-risk pregnancy and has AMA (advanced maternal age) multigravida 35+; Supervision of elderly multigravida, antepartum; History of pre-eclampsia in prior pregnancy, currently pregnant; and Previous cesarean delivery, antepartum condition or complication on her problem list.  Patient reports no complaints.  Contractions: Irritability. Vag. Bleeding: None.  Movement: Present. Denies leaking of fluid.   The following portions of the patient's history were reviewed and updated as appropriate: allergies, current medications, past family history, past medical history, past social history, past surgical history and problem list. Problem list updated.  Objective:   Vitals:   06/07/16 1403  BP: 131/76  Pulse: 72  Weight: 253 lb (114.8 kg)    Fetal Status: Fetal Heart Rate (bpm): 155   Movement: Present     General:  Alert, oriented and cooperative. Patient is in no acute distress.  Skin: Skin is warm and dry. No rash noted.   Cardiovascular: Normal heart rate noted  Respiratory: Normal respiratory effort, no problems with respiration noted  Abdomen: Soft, gravid, appropriate for gestational age. Pain/Pressure: Absent     Pelvic:  Cervical exam deferred        Extremities: Normal range of motion.  Edema: Mild pitting, slight indentation  Mental Status: Normal mood and affect. Normal behavior. Normal judgment and thought content.   Urinalysis: Urine Protein: Trace Urine Glucose: Negative  Assessment and Plan:  Pregnancy: [id number] at 190w5d  Priot c/s--scheduled for rpt on Sept 2nd with BTL  Term labor symptoms and general obstetric precautions including but not limited to vaginal bleeding, contractions, leaking of fluid and fetal movement were reviewed in detail with the patient. Please refer to After Visit Summary for other  counseling recommendations.  Return in about 1 week (around 06/14/2016).   Lesly DukesKelly H Gabriele Zwilling, MD

## 2016-06-08 ENCOUNTER — Telehealth (HOSPITAL_COMMUNITY): Payer: Self-pay | Admitting: *Deleted

## 2016-06-08 NOTE — Telephone Encounter (Signed)
Preadmission screen  

## 2016-06-11 ENCOUNTER — Telehealth (HOSPITAL_COMMUNITY): Payer: Self-pay | Admitting: *Deleted

## 2016-06-11 NOTE — Telephone Encounter (Signed)
Preadmission screen  

## 2016-06-12 ENCOUNTER — Encounter (HOSPITAL_COMMUNITY): Payer: Self-pay

## 2016-06-14 ENCOUNTER — Ambulatory Visit (INDEPENDENT_AMBULATORY_CARE_PROVIDER_SITE_OTHER): Payer: BC Managed Care – PPO | Admitting: Obstetrics & Gynecology

## 2016-06-14 VITALS — BP 131/74 | HR 80 | Wt 255.0 lb

## 2016-06-14 DIAGNOSIS — O368131 Decreased fetal movements, third trimester, fetus 1: Secondary | ICD-10-CM

## 2016-06-14 DIAGNOSIS — O368130 Decreased fetal movements, third trimester, not applicable or unspecified: Secondary | ICD-10-CM | POA: Diagnosis not present

## 2016-06-14 DIAGNOSIS — O09293 Supervision of pregnancy with other poor reproductive or obstetric history, third trimester: Secondary | ICD-10-CM | POA: Diagnosis not present

## 2016-06-14 DIAGNOSIS — O34219 Maternal care for unspecified type scar from previous cesarean delivery: Secondary | ICD-10-CM

## 2016-06-14 DIAGNOSIS — O09523 Supervision of elderly multigravida, third trimester: Secondary | ICD-10-CM | POA: Diagnosis not present

## 2016-06-14 NOTE — Progress Notes (Signed)
C-Section scheduled for Sat

## 2016-06-14 NOTE — Patient Instructions (Signed)
Cesarean Delivery, Care After  Refer to this sheet in the next few weeks. These instructions provide you with information on caring for yourself after your procedure. Your health care provider may also give you specific instructions. Your treatment has been planned according to current medical practices, but problems sometimes occur. Call your health care provider if you have any problems or questions after you go home.  HOME CARE INSTRUCTIONS   Only take over-the-counter or prescription medications as directed by your health care provider.   Do not drink alcohol, especially if you are breastfeeding or taking medication to relieve pain.   Do not chew or smoke tobacco.   Continue to use good perineal care. Good perineal care includes:    Wiping your perineum from front to back.    Keeping your perineum clean.   Check your surgical cut (incision) daily for increased redness, drainage, swelling, or separation of skin.   Clean your incision gently with soap and water every day, and then pat it dry. If your health care provider says it is okay, leave the incision uncovered. Use a bandage (dressing) if the incision is draining fluid or appears irritated. If the adhesive strips across the incision do not fall off within 7 days, carefully peel them off.   Hug a pillow when coughing or sneezing until your incision is healed. This helps to relieve pain.   Do not use tampons or douche until your health care provider says it is okay.   Shower, wash your hair, and take tub baths as directed by your health care provider.   Wear a well-fitting bra that provides breast support.   Limit wearing support panties or control-top hose.   Drink enough fluids to keep your urine clear or pale yellow.   Eat high-fiber foods such as whole grain cereals and breads, brown rice, beans, and fresh fruits and vegetables every day. These foods may help prevent or relieve constipation.   Resume activities such as climbing stairs,  driving, lifting, exercising, or traveling as directed by your health care provider.   Talk to your health care provider about resuming sexual activities. This is dependent upon your risk of infection, your rate of healing, and your comfort and desire to resume sexual activity.   Try to have someone help you with your household activities and your newborn for at least a few days after you leave the hospital.   Rest as much as possible. Try to rest or take a nap when your newborn is sleeping.   Increase your activities gradually.   Keep all of your scheduled postpartum appointments. It is very important to keep your scheduled follow-up appointments. At these appointments, your health care provider will be checking to make sure that you are healing physically and emotionally.  SEEK MEDICAL CARE IF:    You are passing large clots from your vagina. Save any clots to show your health care provider.   You have a foul smelling discharge from your vagina.   You have trouble urinating.   You are urinating frequently.   You have pain when you urinate.   You have a change in your bowel movements.   You have increasing redness, pain, or swelling near your incision.   You have pus draining from your incision.   Your incision is separating.   You have painful, hard, or reddened breasts.   You have a severe headache.   You have blurred vision or see spots.   You feel sad   or depressed.   You have thoughts of hurting yourself or your newborn.   You have questions about your care, the care of your newborn, or medications.   You are dizzy or light-headed.   You have a rash.   You have pain, redness, or swelling at the site of the removed intravenous access (IV) tube.   You have nausea or vomiting.   You stopped breastfeeding and have not had a menstrual period within 12 weeks of stopping.   You are not breastfeeding and have not had a menstrual period within 12 weeks of delivery.   You have a fever.  SEEK  IMMEDIATE MEDICAL CARE IF:   You have persistent pain.   You have chest pain.   You have shortness of breath.   You faint.   You have leg pain.   You have stomach pain.   Your vaginal bleeding saturates 2 or more sanitary pads in 1 hour.  MAKE SURE YOU:    Understand these instructions.   Will watch your condition.   Will get help right away if you are not doing well or get worse.     This information is not intended to replace advice given to you by your health care provider. Make sure you discuss any questions you have with your health care provider.     Document Released: 06/23/2002 Document Revised: 10/22/2014 Document Reviewed: 05/28/2012  Elsevier Interactive Patient Education 2016 [employer]

## 2016-06-14 NOTE — Progress Notes (Signed)
   PRENATAL VISIT NOTE  Subjective:  Savannah Nixon is a 40 y.o. [id number] at 2965w5d being seen today for ongoing prenatal care.  She is currently monitored for the following issues for this high-risk pregnancy and has AMA (advanced maternal age) multigravida 35+; Supervision of elderly multigravida, antepartum; History of pre-eclampsia in prior pregnancy, currently pregnant; Previous cesarean delivery, antepartum condition or complication; and Decreased fetal movement in pregnancy in third trimester, antepartum on her problem list.  Patient reports decreased fetal movement.  Contractions: Irritability. Vag. Bleeding: None.  Movement: Present. Denies leaking of fluid. Pt has had leakage of urine but, she reports hat it always when she needs to void.  The following portions of the patient's history were reviewed and updated as appropriate: allergies, current medications, past family history, past medical history, past social history, past surgical history and problem list. Problem list updated.  Objective:   Vitals:   06/14/16 1414  BP: 131/74  Pulse: 80  Weight: 255 lb (115.7 kg)    Fetal Status:   Fundal Height: 43 cm Movement: Present     General:  Alert, oriented and cooperative. Patient is in no acute distress.  Skin: Skin is warm and dry. No rash noted.   Cardiovascular: Normal heart rate noted  Respiratory: Normal respiratory effort, no problems with respiration noted  Abdomen: Soft, gravid, appropriate for gestational age. Pain/Pressure: Present     Pelvic:  Cervical exam deferred        Extremities: Normal range of motion.  Edema: Mild pitting, slight indentation  Mental Status: Normal mood and affect. Normal behavior. Normal judgment and thought content.   Urinalysis: Urine Protein: Trace Urine Glucose: Negative  Assessment and Plan:  Pregnancy: [id number] at 4365w5d  1. Supervision of elderly multigravida, antepartum, third trimester  2. Previous cesarean delivery, antepartum  condition or complication Scheduled for repeat c/s in 2 days  3. History of pre-eclampsia in prior pregnancy, currently pregnant, third trimester BP stable  4. AMA (advanced maternal age) multigravida 35+, third trimester  5. Decreased fetal movement in pregnancy in third trimester, antepartum, fetus 1 NST reviewed and reactive.   Term labor symptoms and general obstetric precautions including but not limited to vaginal bleeding, contractions, leaking of fluid and fetal movement were reviewed in detail with the patient. Please refer to After Visit Summary for other counseling recommendations.  Return in about 4 weeks (around 07/12/2016) for post op check.  Willodean Rosenthalarolyn Harraway-Smith, MD

## 2016-06-15 ENCOUNTER — Encounter (HOSPITAL_COMMUNITY)
Admission: RE | Admit: 2016-06-15 | Discharge: 2016-06-15 | Disposition: A | Discharge status: Home / Self Care | Payer: BC Managed Care – PPO | Source: Ambulatory Visit

## 2016-06-16 ENCOUNTER — Inpatient Hospital Stay (HOSPITAL_COMMUNITY)
Admission: RE | Admit: 2016-06-16 | Payer: BC Managed Care – PPO | Source: Ambulatory Visit | Admitting: Obstetrics & Gynecology

## 2016-06-16 ENCOUNTER — Encounter (HOSPITAL_COMMUNITY)
Admission: AD | Disposition: A | Discharge status: Home / Self Care | Payer: Self-pay | Source: Ambulatory Visit | Attending: Obstetrics & Gynecology

## 2016-06-16 ENCOUNTER — Inpatient Hospital Stay (HOSPITAL_COMMUNITY): Payer: BC Managed Care – PPO | Admitting: Anesthesiology

## 2016-06-16 ENCOUNTER — Encounter (HOSPITAL_COMMUNITY): Payer: Self-pay | Admitting: Anesthesiology

## 2016-06-16 ENCOUNTER — Inpatient Hospital Stay (HOSPITAL_COMMUNITY)
Admission: AD | Admit: 2016-06-16 | Discharge: 2016-06-20 | DRG: 765 | Disposition: A | Discharge status: Home / Self Care | Payer: BC Managed Care – PPO | Source: Ambulatory Visit | Attending: Obstetrics & Gynecology | Admitting: Obstetrics & Gynecology

## 2016-06-16 DIAGNOSIS — R030 Elevated blood-pressure reading, without diagnosis of hypertension: Secondary | ICD-10-CM | POA: Diagnosis present

## 2016-06-16 DIAGNOSIS — O99824 Streptococcus B carrier state complicating childbirth: Secondary | ICD-10-CM | POA: Diagnosis present

## 2016-06-16 DIAGNOSIS — Z3A39 39 weeks gestation of pregnancy: Secondary | ICD-10-CM | POA: Diagnosis not present

## 2016-06-16 DIAGNOSIS — O99214 Obesity complicating childbirth: Secondary | ICD-10-CM | POA: Diagnosis present

## 2016-06-16 DIAGNOSIS — O34219 Maternal care for unspecified type scar from previous cesarean delivery: Secondary | ICD-10-CM | POA: Diagnosis present

## 2016-06-16 DIAGNOSIS — O9089 Other complications of the puerperium, not elsewhere classified: Secondary | ICD-10-CM | POA: Diagnosis present

## 2016-06-16 DIAGNOSIS — Z302 Encounter for sterilization: Secondary | ICD-10-CM

## 2016-06-16 DIAGNOSIS — O09529 Supervision of elderly multigravida, unspecified trimester: Secondary | ICD-10-CM

## 2016-06-16 DIAGNOSIS — Z8249 Family history of ischemic heart disease and other diseases of the circulatory system: Secondary | ICD-10-CM | POA: Diagnosis not present

## 2016-06-16 DIAGNOSIS — Z6841 Body Mass Index (BMI) 40.0 and over, adult: Secondary | ICD-10-CM | POA: Diagnosis not present

## 2016-06-16 DIAGNOSIS — O34211 Maternal care for low transverse scar from previous cesarean delivery: Principal | ICD-10-CM | POA: Diagnosis present

## 2016-06-16 DIAGNOSIS — Z98891 History of uterine scar from previous surgery: Secondary | ICD-10-CM

## 2016-06-16 DIAGNOSIS — Z9851 Tubal ligation status: Secondary | ICD-10-CM

## 2016-06-16 LAB — CBC
HCT: 31.1 % — ABNORMAL LOW (ref 36.0–46.0)
HEMOGLOBIN: 10.7 g/dL — AB (ref 12.0–15.0)
MCH: 30.6 pg (ref 26.0–34.0)
MCHC: 34.4 g/dL (ref 30.0–36.0)
MCV: 88.9 fL (ref 78.0–100.0)
PLATELETS: 179 10*3/uL (ref 150–400)
RBC: 3.5 MIL/uL — AB (ref 3.87–5.11)
RDW: 13.3 % (ref 11.5–15.5)
WBC: 10.1 10*3/uL (ref 4.0–10.5)

## 2016-06-16 LAB — TYPE AND SCREEN
ABO/RH(D): O POS
ANTIBODY SCREEN: NEGATIVE

## 2016-06-16 LAB — ABO/RH: ABO/RH(D): O POS

## 2016-06-16 SURGERY — Surgical Case
Anesthesia: Spinal | Laterality: Bilateral

## 2016-06-16 MED ORDER — NALBUPHINE HCL 10 MG/ML IJ SOLN: 5.0000 mg | INTRAMUSCULAR | Status: DC | PRN

## 2016-06-16 MED ORDER — OXYTOCIN 10 UNIT/ML IJ SOLN
INTRAMUSCULAR | Status: DC | PRN
  Administered 2016-06-16: 40 [IU] via INTRAVENOUS

## 2016-06-16 MED ORDER — KETOROLAC TROMETHAMINE 30 MG/ML IJ SOLN: 30.0000 mg | Freq: Four times a day (QID) | INTRAMUSCULAR | Status: DC | PRN

## 2016-06-16 MED ORDER — KETOROLAC TROMETHAMINE 30 MG/ML IJ SOLN
INTRAMUSCULAR | Status: AC
  Filled 2016-06-16: qty 1

## 2016-06-16 MED ORDER — LACTATED RINGERS IV SOLN
INTRAVENOUS | Status: DC
  Administered 2016-06-16 (×3): via INTRAVENOUS

## 2016-06-16 MED ORDER — IBUPROFEN 600 MG PO TABS
600.0000 mg | ORAL_TABLET | Freq: Four times a day (QID) | ORAL | Status: DC
  Administered 2016-06-16 – 2016-06-20 (×17): 600 mg via ORAL
  Filled 2016-06-16 (×17): qty 1

## 2016-06-16 MED ORDER — DIPHENHYDRAMINE HCL 25 MG PO CAPS: 25.0000 mg | ORAL_CAPSULE | Freq: Four times a day (QID) | ORAL | Status: DC | PRN

## 2016-06-16 MED ORDER — FENTANYL CITRATE (PF) 100 MCG/2ML IJ SOLN
25.0000 ug | INTRAMUSCULAR | Status: DC | PRN
Start: 2016-06-16 — End: 2016-06-16

## 2016-06-16 MED ORDER — DIPHENHYDRAMINE HCL 50 MG/ML IJ SOLN: 12.5000 mg | INTRAMUSCULAR | Status: DC | PRN

## 2016-06-16 MED ORDER — ONDANSETRON HCL 4 MG/2ML IJ SOLN: 4.0000 mg | Freq: Three times a day (TID) | INTRAMUSCULAR | Status: DC | PRN

## 2016-06-16 MED ORDER — NALOXONE HCL 2 MG/2ML IJ SOSY
1.0000 ug/kg/h | PREFILLED_SYRINGE | INTRAVENOUS | Status: DC | PRN
  Filled 2016-06-16: qty 2

## 2016-06-16 MED ORDER — SCOPOLAMINE 1 MG/3DAYS TD PT72
MEDICATED_PATCH | TRANSDERMAL | Status: DC | PRN
  Administered 2016-06-16: 1 via TRANSDERMAL

## 2016-06-16 MED ORDER — NALBUPHINE HCL 10 MG/ML IJ SOLN: 5.0000 mg | Freq: Once | INTRAMUSCULAR | Status: DC | PRN

## 2016-06-16 MED ORDER — MENTHOL 3 MG MT LOZG: 1.0000 | LOZENGE | OROMUCOSAL | Status: DC | PRN

## 2016-06-16 MED ORDER — BUPIVACAINE HCL (PF) 0.5 % IJ SOLN
INTRAMUSCULAR | Status: AC
  Filled 2016-06-16: qty 30

## 2016-06-16 MED ORDER — NALOXONE HCL 0.4 MG/ML IJ SOLN: 0.4000 mg | INTRAMUSCULAR | Status: DC | PRN

## 2016-06-16 MED ORDER — SIMETHICONE 80 MG PO CHEW
80.0000 mg | CHEWABLE_TABLET | Freq: Three times a day (TID) | ORAL | Status: DC
  Administered 2016-06-16 – 2016-06-20 (×11): 80 mg via ORAL
  Filled 2016-06-16 (×12): qty 1

## 2016-06-16 MED ORDER — ZOLPIDEM TARTRATE 5 MG PO TABS: 5.0000 mg | ORAL_TABLET | Freq: Every evening | ORAL | Status: DC | PRN

## 2016-06-16 MED ORDER — BUPIVACAINE IN DEXTROSE 0.75-8.25 % IT SOLN
INTRATHECAL | Status: DC | PRN
  Administered 2016-06-16: 1.6 mL via INTRATHECAL

## 2016-06-16 MED ORDER — BUPIVACAINE HCL (PF) 0.5 % IJ SOLN
INTRAMUSCULAR | Status: DC | PRN
  Administered 2016-06-16: 30 mL

## 2016-06-16 MED ORDER — PHENYLEPHRINE 8 MG IN D5W 100 ML (0.08MG/ML) PREMIX OPTIME
INJECTION | INTRAVENOUS | Status: DC | PRN
  Administered 2016-06-16: 60 ug/min via INTRAVENOUS

## 2016-06-16 MED ORDER — OXYTOCIN 40 UNITS IN LACTATED RINGERS INFUSION - SIMPLE MED: 2.5000 [IU]/h | INTRAVENOUS | Status: AC

## 2016-06-16 MED ORDER — OXYCODONE-ACETAMINOPHEN 5-325 MG PO TABS
1.0000 | ORAL_TABLET | ORAL | Status: DC | PRN
  Administered 2016-06-17: 1 via ORAL
  Filled 2016-06-16 (×2): qty 1

## 2016-06-16 MED ORDER — ACETAMINOPHEN 500 MG PO TABS
1000.0000 mg | ORAL_TABLET | Freq: Four times a day (QID) | ORAL | Status: AC
  Administered 2016-06-16 – 2016-06-17 (×3): 1000 mg via ORAL
  Filled 2016-06-16 (×3): qty 2

## 2016-06-16 MED ORDER — SODIUM CHLORIDE 0.9 % IR SOLN
Status: DC | PRN
  Administered 2016-06-16: 1000 mL

## 2016-06-16 MED ORDER — SOD CITRATE-CITRIC ACID 500-334 MG/5ML PO SOLN
ORAL | Status: AC
  Administered 2016-06-16: 30 mL
  Filled 2016-06-16: qty 15

## 2016-06-16 MED ORDER — MEPERIDINE HCL 25 MG/ML IJ SOLN: 6.2500 mg | INTRAMUSCULAR | Status: DC | PRN

## 2016-06-16 MED ORDER — PROMETHAZINE HCL 25 MG/ML IJ SOLN: 6.2500 mg | INTRAMUSCULAR | Status: DC | PRN

## 2016-06-16 MED ORDER — LACTATED RINGERS IV SOLN
INTRAVENOUS | Status: DC | PRN
  Administered 2016-06-16: 10:00:00 via INTRAVENOUS

## 2016-06-16 MED ORDER — COCONUT OIL OIL
1.0000 | TOPICAL_OIL | Status: DC | PRN
Start: 2016-06-16 — End: 2016-06-20
  Administered 2016-06-19: 1 via TOPICAL
  Filled 2016-06-16: qty 120

## 2016-06-16 MED ORDER — FENTANYL CITRATE (PF) 100 MCG/2ML IJ SOLN
INTRAMUSCULAR | Status: AC
  Filled 2016-06-16: qty 2

## 2016-06-16 MED ORDER — ONDANSETRON HCL 4 MG/2ML IJ SOLN
INTRAMUSCULAR | Status: DC | PRN
  Administered 2016-06-16: 4 mg via INTRAVENOUS

## 2016-06-16 MED ORDER — SIMETHICONE 80 MG PO CHEW
80.0000 mg | CHEWABLE_TABLET | ORAL | Status: DC
Start: 2016-06-17 — End: 2016-06-20
  Administered 2016-06-16 – 2016-06-19 (×4): 80 mg via ORAL
  Filled 2016-06-16 (×4): qty 1

## 2016-06-16 MED ORDER — LACTATED RINGERS IV SOLN
INTRAVENOUS | Status: DC
  Administered 2016-06-16 (×2): via INTRAVENOUS

## 2016-06-16 MED ORDER — SCOPOLAMINE 1 MG/3DAYS TD PT72
MEDICATED_PATCH | TRANSDERMAL | Status: AC
  Filled 2016-06-16: qty 1

## 2016-06-16 MED ORDER — TETANUS-DIPHTH-ACELL PERTUSSIS 5-2.5-18.5 LF-MCG/0.5 IM SUSP: 0.5000 mL | Freq: Once | INTRAMUSCULAR | Status: DC

## 2016-06-16 MED ORDER — WITCH HAZEL-GLYCERIN EX PADS: 1.0000 "application " | MEDICATED_PAD | CUTANEOUS | Status: DC | PRN

## 2016-06-16 MED ORDER — BUPIVACAINE IN DEXTROSE 0.75-8.25 % IT SOLN
INTRATHECAL | Status: AC
  Filled 2016-06-16: qty 2

## 2016-06-16 MED ORDER — OXYCODONE-ACETAMINOPHEN 5-325 MG PO TABS: 2.0000 | ORAL_TABLET | ORAL | Status: DC | PRN

## 2016-06-16 MED ORDER — DEXAMETHASONE SODIUM PHOSPHATE 10 MG/ML IJ SOLN
INTRAMUSCULAR | Status: DC | PRN
  Administered 2016-06-16: 4 mg via INTRAVENOUS

## 2016-06-16 MED ORDER — FENTANYL CITRATE (PF) 100 MCG/2ML IJ SOLN
INTRAMUSCULAR | Status: DC | PRN
  Administered 2016-06-16: 20 ug via INTRAVENOUS

## 2016-06-16 MED ORDER — PRENATAL MULTIVITAMIN CH
1.0000 | ORAL_TABLET | Freq: Every day | ORAL | Status: DC
  Administered 2016-06-17 – 2016-06-20 (×4): 1 via ORAL
  Filled 2016-06-16 (×4): qty 1

## 2016-06-16 MED ORDER — DIPHENHYDRAMINE HCL 25 MG PO CAPS
25.0000 mg | ORAL_CAPSULE | ORAL | Status: DC | PRN
  Filled 2016-06-16: qty 1

## 2016-06-16 MED ORDER — CEFAZOLIN SODIUM-DEXTROSE 2-4 GM/100ML-% IV SOLN
2.0000 g | INTRAVENOUS | Status: AC
  Administered 2016-06-16: 2 g via INTRAVENOUS

## 2016-06-16 MED ORDER — SIMETHICONE 80 MG PO CHEW: 80.0000 mg | CHEWABLE_TABLET | ORAL | Status: DC | PRN

## 2016-06-16 MED ORDER — SODIUM CHLORIDE 0.9% FLUSH: 3.0000 mL | INTRAVENOUS | Status: DC | PRN

## 2016-06-16 MED ORDER — MORPHINE SULFATE (PF) 0.5 MG/ML IJ SOLN
INTRAMUSCULAR | Status: DC | PRN
  Administered 2016-06-16: .2 mg via EPIDURAL

## 2016-06-16 MED ORDER — DIBUCAINE 1 % RE OINT: 1.0000 "application " | TOPICAL_OINTMENT | RECTAL | Status: DC | PRN

## 2016-06-16 MED ORDER — SCOPOLAMINE 1 MG/3DAYS TD PT72: 1.0000 | MEDICATED_PATCH | Freq: Once | TRANSDERMAL | Status: DC

## 2016-06-16 MED ORDER — ACETAMINOPHEN 325 MG PO TABS
650.0000 mg | ORAL_TABLET | ORAL | Status: DC | PRN
  Administered 2016-06-19 – 2016-06-20 (×3): 650 mg via ORAL
  Filled 2016-06-16 (×3): qty 2

## 2016-06-16 MED ORDER — SENNOSIDES-DOCUSATE SODIUM 8.6-50 MG PO TABS
2.0000 | ORAL_TABLET | ORAL | Status: DC
  Administered 2016-06-16 – 2016-06-19 (×3): 2 via ORAL
  Filled 2016-06-16 (×3): qty 2

## 2016-06-16 MED ORDER — MORPHINE SULFATE-NACL 0.5-0.9 MG/ML-% IV SOSY
PREFILLED_SYRINGE | INTRAVENOUS | Status: AC
  Filled 2016-06-16: qty 1

## 2016-06-16 SURGICAL SUPPLY — 37 items
APL SKNCLS STERI-STRIP NONHPOA (GAUZE/BANDAGES/DRESSINGS) ×1
BARRIER ADHS 3X4 INTERCEED (GAUZE/BANDAGES/DRESSINGS) IMPLANT
BENZOIN TINCTURE PRP APPL 2/3 (GAUZE/BANDAGES/DRESSINGS) ×3 IMPLANT
BLADE SURG 10 STRL SS (BLADE) ×3 IMPLANT
BRR ADH 4X3 ABS CNTRL BYND (GAUZE/BANDAGES/DRESSINGS)
CLAMP CORD UMBIL (MISCELLANEOUS) IMPLANT
CLIP FILSHIE TUBAL LIGA STRL (Clip) ×6 IMPLANT
CLOSURE WOUND 1/2 X4 (GAUZE/BANDAGES/DRESSINGS) ×1
CLOTH BEACON ORANGE TIMEOUT ST (SAFETY) ×3 IMPLANT
DRSG OPSITE POSTOP 4X10 (GAUZE/BANDAGES/DRESSINGS) ×6 IMPLANT
DURAPREP 26ML APPLICATOR (WOUND CARE) ×3 IMPLANT
ELECT REM PT RETURN 9FT ADLT (ELECTROSURGICAL) ×3
ELECTRODE REM PT RTRN 9FT ADLT (ELECTROSURGICAL) ×1 IMPLANT
EXTRACTOR VACUUM KIWI (MISCELLANEOUS) IMPLANT
GLOVE BIO SURGEON STRL SZ 6.5 (GLOVE) ×2 IMPLANT
GLOVE BIO SURGEONS STRL SZ 6.5 (GLOVE) ×1
GLOVE BIOGEL PI IND STRL 7.0 (GLOVE) ×2 IMPLANT
GLOVE BIOGEL PI INDICATOR 7.0 (GLOVE) ×4
GOWN STRL REUS W/TWL LRG LVL3 (GOWN DISPOSABLE) ×6 IMPLANT
KIT ABG SYR 3ML LUER SLIP (SYRINGE) IMPLANT
NEEDLE HYPO 22GX1.5 SAFETY (NEEDLE) IMPLANT
NEEDLE HYPO 25X5/8 SAFETYGLIDE (NEEDLE) IMPLANT
NS IRRIG 1000ML POUR BTL (IV SOLUTION) ×3 IMPLANT
PACK C SECTION WH (CUSTOM PROCEDURE TRAY) ×3 IMPLANT
PAD ABD 7.5X8 STRL (GAUZE/BANDAGES/DRESSINGS) ×2 IMPLANT
PAD OB MATERNITY 4.3X12.25 (PERSONAL CARE ITEMS) ×3 IMPLANT
PENCIL SMOKE EVAC W/HOLSTER (ELECTROSURGICAL) ×3 IMPLANT
RTRCTR C-SECT PINK 25CM LRG (MISCELLANEOUS) IMPLANT
SPONGE GAUZE 4X4 FOR O.R. (GAUZE/BANDAGES/DRESSINGS) ×4 IMPLANT
STRIP CLOSURE SKIN 1/2X4 (GAUZE/BANDAGES/DRESSINGS) ×1 IMPLANT
SUT VIC AB 0 CT1 36 (SUTURE) ×18 IMPLANT
SUT VIC AB 2-0 CT1 27 (SUTURE) ×3
SUT VIC AB 2-0 CT1 TAPERPNT 27 (SUTURE) ×1 IMPLANT
SUT VIC AB 4-0 PS2 27 (SUTURE) ×3 IMPLANT
SYR CONTROL 10ML LL (SYRINGE) IMPLANT
TOWEL OR 17X24 6PK STRL BLUE (TOWEL DISPOSABLE) ×3 IMPLANT
TRAY FOLEY CATH SILVER 14FR (SET/KITS/TRAYS/PACK) IMPLANT

## 2016-06-16 NOTE — Anesthesia Postprocedure Evaluation (Signed)
Anesthesia Post Note  Patient: Savannah Nixon  Procedure(s) Performed: Procedure(s) (LRB): REPEAT CESAREAN SECTION WITH BILATERAL TUBAL LIGATION (Bilateral)  Patient location during evaluation: PACU Anesthesia Type: Spinal Level of consciousness: oriented and awake and alert Pain management: pain level controlled Vital Signs Assessment: post-procedure vital signs reviewed and stable Respiratory status: spontaneous breathing, respiratory function stable and patient connected to nasal cannula oxygen Cardiovascular status: blood pressure returned to baseline and stable Postop Assessment: no headache, no backache, spinal receding and patient able to bend at knees Anesthetic complications: no     Last Vitals:  Vitals:   06/16/16 1241 06/16/16 1348  BP: 129/81 130/68  Pulse: 68 62  Resp: 18 18  Temp: 36.9 C 36.9 C    Last Pain:  Vitals:   06/16/16 1350  TempSrc:   PainSc: 2    Pain Goal: Patients Stated Pain Goal: 8 (06/16/16 0734)               Dwain Huhn J

## 2016-06-16 NOTE — Lactation Note (Signed)
This note was copied from a baby's chart. Lactation Consultation Note  P3, Ex BF 16 months with 2nd and 9 months w/ first. Pecola LeisureBaby has breastfed a few times since birth. Attempted latching but baby sleepy.  Mother states she knows how to hand express. Provided mother w/ hand pump. Discussed basics. Reviewed Baby & Me booklet chart voids/stools. Mom encouraged to feed baby 8-12 times/24 hours and with feeding cues.  Reviewed engorgement care and monitoring voids/stools.   Patient Name: Savannah Nixon Today's Date: 06/16/2016 Reason for consult: Initial assessment   Maternal Data Has patient been taught Hand Expression?: Yes Does the patient have breastfeeding experience prior to this delivery?: Yes  Feeding Feeding Type: Breast Fed Length of feed: 30 min  LATCH Score/Interventions                      Lactation Tools Discussed/Used     Consult Status Consult Status: Follow-up Date: 06/17/16 Follow-up type: In-patient    Savannah Nixon, Savannah Nixon 06/16/2016, 9:23 PM

## 2016-06-16 NOTE — Anesthesia Postprocedure Evaluation (Signed)
Anesthesia Post Note  Patient: Zebedee IbaKaren R Majano  Procedure(s) Performed: Procedure(s) (LRB): REPEAT CESAREAN SECTION WITH BILATERAL TUBAL LIGATION (Bilateral)  Patient location during evaluation: Mother Baby Anesthesia Type: Combined Spinal/Epidural Level of consciousness: awake Pain management: pain level controlled Vital Signs Assessment: post-procedure vital signs reviewed and stable Respiratory status: spontaneous breathing Cardiovascular status: blood pressure returned to baseline Postop Assessment: no headache, no backache, epidural receding, spinal receding, patient able to bend at knees, no signs of nausea or vomiting and adequate PO intake Anesthetic complications: no     Last Vitals:  Vitals:   06/16/16 1348 06/16/16 1506  BP: 130/68 113/61  Pulse: 62 62  Resp: 18 20  Temp: 36.9 C 37 C    Last Pain:  Vitals:   06/16/16 1508  TempSrc:   PainSc: 0-No pain   Pain Goal: Patients Stated Pain Goal: 8 (06/16/16 0734)               Fanny DanceMULLINS,Baylyn Sickles

## 2016-06-16 NOTE — Op Note (Signed)
Cesarean Section Operative Report  Savannah IbaKaren R Collier  06/16/2016  Indications: Scheduled Proceedure/Maternal Request for Repeat cesarean  Pre-operative Diagnosis: elective repeat cesarean section and bilateral tubal ligation; desires sterilization.   Post-operative Diagnosis: Same   Surgeon: Surgeon(s) and Role:    * Adam PhenixJames G Arnold, MD - Primary    * Michaele OfferElizabeth Woodland Mumaw, DO - Resident - Assisting   Attending Attestation: I was present and scrubbed for the entire procedure.   Assistants: Jen MowElizabeth Mumaw, DO  Anesthesia: spinal    Estimated Blood Loss: 600 ml  Total IV Fluids: 3000 ml LR  Urine Output:: 400 ml clear urine  Specimens: None  Findings: Viable female infant in cephalic presentation; Apgars pending documentation; weight pending; arterial cord pH not collected; clear amniotic fluid; intact placenta with three vessel cord; normal uterus, fallopian tubes and ovaries bilaterally.  Baby condition / location:  Couplet care / Skin to Skin   Complications: no complications  Indications: Savannah Nixon is a 40 y.o. [id number] with an IUP 8286w0d presenting for repeat cesarean delivery with BTL.  The risks, benefits, complications, treatment options, and exected outcomes were discussed with the patient . The patient dwith the proposed plan, giving informed consent. identified as Savannah Nixon and the procedure verified as C-Section Delivery.  Procedure Details:  The patient was taken back to the operative suite where spinal anesthesia was placed.  A time out was held and the above information confirmed.   After induction of anesthesia, the patient was draped and prepped in the usual sterile manner and placed in a dorsal supine position with a leftward tilt. A Pfannenstiel incision was made and carried down through the subcutaneous tissue to the fascia. Fascial incision was made and extended transversely. The fascia with dense adhesions was separated from the underlying rectus  tissue superiorly and inferiorly. The peritoneum was identified and entered and extended longitudinally. Alexis retractor was placed. A low transverse uterine incision was made and extended bluntly. Delivered from cephalic presentation was a viable infant with Apgars and weight as above.  After waiting 60 seconds for delayed cord cutting, the umbilical cord was clamped and cut cord blood was obtained for evaluation. Cord ph was not sent. The placenta was removed Intact and appeared normal. The uterine outline, tubes and ovaries appeared normal. The uterine incision was closed with running locked sutures of 0Vicryl with an imbricating layer of the same.   Hemostasis was observed. The Fallopian tubes were identified bilaterally.  A Filshie clip was placed on each tube without difficulty 3 cm from the cornua. 0.5% Marcaine was sprinkled over each tube. There was no bleeding. The peritoneum was closed with 2-0 Vicryl. The rectus muscles were examined and hemostasis observed. The fascia was then reapproximated with running sutures of 2-0 Vicryl. A total of 30 ml 0.5% Marcaine was injected subcutaneously at the margins of the incision. The subcuticular closure was performed using 2-0 chromic gut. The skin was closed with 4-0 Vicryl.   Instrument, sponge, and needle counts were correct prior the abdominal closure and were correct at the conclusion of the case.     Disposition: PACU - hemodynamically stable.   Maternal Condition: stable       SignedJen Mow: Elizabeth Mumaw, DO 06/16/2016 11:20 AM

## 2016-06-16 NOTE — Transfer of Care (Signed)
Immediate Anesthesia Transfer of Care Note  Patient: Savannah Nixon  Procedure(s) Performed: Procedure(s): REPEAT CESAREAN SECTION WITH BILATERAL TUBAL LIGATION (Bilateral)  Patient Location: PACU  Anesthesia Type:Spinal  Level of Consciousness: awake, alert  and oriented  Airway & Oxygen Therapy: Patient Spontanous Breathing  Post-op Assessment: Report given to RN  Post vital signs: Reviewed and stable  Last Vitals:  Vitals:   06/16/16 0742 06/16/16 0801  BP: (!) 155/88 133/85  Pulse: 78 68  Resp:    Temp:      Last Pain:  Vitals:   06/16/16 0741  TempSrc: Oral  PainSc:       Patients Stated Pain Goal: 8 (06/16/16 0734)  Complications: No apparent anesthesia complications

## 2016-06-16 NOTE — Anesthesia Preprocedure Evaluation (Addendum)
Anesthesia Evaluation  Patient identified by MRN, date of birth, ID band Patient awake    Reviewed: Allergy & Precautions, NPO status , Patient's Chart, lab work & pertinent test results  Airway Mallampati: II  TM Distance: >3 FB Neck ROM: Full    Dental no notable dental hx.    Pulmonary neg pulmonary ROS,    Pulmonary exam normal breath sounds clear to auscultation       Cardiovascular hypertension, negative cardio ROS Normal cardiovascular exam Rhythm:Regular Rate:Normal     Neuro/Psych negative neurological ROS  negative psych ROS   GI/Hepatic negative GI ROS, Neg liver ROS,   Endo/Other  Morbid obesity  Renal/GU negative Renal ROS  negative genitourinary   Musculoskeletal negative musculoskeletal ROS (+)   Abdominal (+) + obese,   Peds negative pediatric ROS (+)  Hematology negative hematology ROS (+)   Anesthesia Other Findings   Reproductive/Obstetrics negative OB ROS                             Anesthesia Physical Anesthesia Plan  ASA: III  Anesthesia Plan: Combined Spinal and Epidural   Post-op Pain Management:    Induction: Intravenous  Airway Management Planned: Natural Airway  Additional Equipment:   Intra-op Plan:   Post-operative Plan:   Informed Consent: I have reviewed the patients History and Physical, chart, labs and discussed the procedure including the risks, benefits and alternatives for the proposed anesthesia with the patient or authorized representative who has indicated his/her understanding and acceptance.   Dental advisory given  Plan Discussed with: CRNA  Anesthesia Plan Comments: (Informed consent obtained prior to proceeding including risk of failure, 1% risk of PDPH, risk of minor discomfort and bruising.  Discussed rare but serious complications including epidural abscess, permanent nerve injury, epidural hematoma.  Discussed alternatives to  spinal/epidural anesthesia/analgesia and patient desires to proceed.  Timeout performed pre-procedure verifying patient name, procedure, and platelet count.  Patient tolerated procedure well.)       Anesthesia Quick Evaluation

## 2016-06-16 NOTE — Anesthesia Procedure Notes (Signed)
Spinal  Patient location during procedure: OR Staffing Anesthesiologist: Sherrian DiversENENNY, Cristan Scherzer Preanesthetic Checklist Completed: patient identified, site marked, surgical consent, pre-op evaluation, timeout performed, IV checked, risks and benefits discussed and monitors and equipment checked Spinal Block Patient position: sitting Prep: DuraPrep Patient monitoring: blood pressure, continuous pulse ox, cardiac monitor and heart rate Approach: midline Location: L3-4 Injection technique: single-shot Needle Needle type: Pencan  Needle gauge: 24 G Needle length: 10 cm Additional Notes CSF clear. No paresthesia.

## 2016-06-16 NOTE — Progress Notes (Signed)
Notified Dr Omer JackMumaw patient complains of blurred vision.

## 2016-06-16 NOTE — Anesthesia Procedure Notes (Signed)
Epidural Patient location during procedure: OR  Staffing Anesthesiologist: Sherrian DiversENENNY, Dovid Bartko  Preanesthetic Checklist Completed: patient identified, site marked, surgical consent, pre-op evaluation, timeout performed, IV checked, risks and benefits discussed and monitors and equipment checked  Epidural Patient position: sitting Prep: DuraPrep Patient monitoring: blood pressure, continuous pulse ox, cardiac monitor and heart rate Approach: midline Location: L3-L4 Injection technique: LOR saline  Needle:  Needle type: Tuohy  Needle gauge: 17 G Needle length: 9 cm Needle insertion depth: 7 cm Catheter type: closed end flexible Catheter size: 19 Gauge Catheter at skin depth: 15 cm Test dose: negative and Other  Assessment Events: blood not aspirated, injection not painful, no injection resistance, negative IV test and no paresthesia  Additional Notes Reason for block:surgical anesthesia

## 2016-06-16 NOTE — H&P (Signed)
LABOR AND DELIVERY ADMISSION HISTORY AND PHYSICAL NOTE  Savannah Nixon is a 40 y.o. female [phone] with IUP at 13w0dby 7 wk UKoreapresenting for scheduled repeat cesarean with BTL, h/o two previous cesareans.  She reports positive fetal movement. She denies leakage of fluid or vaginal bleeding.  Prenatal History/Complications:  AMA  Past Medical History: Past Medical History:  Diagnosis Date  . PIH (pregnancy induced hypertension)     Past Surgical History: Past Surgical History:  Procedure Laterality Date  . CESAREAN SECTION      Obstetrical History: OB History    Gravida Para Term Preterm AB Living   4 2 1 1 1 2    SAB TAB Ectopic Multiple Live Births   1              Social History: Social History   Social History  . Marital status: Married    Spouse name: N/A  . Number of children: N/A  . Years of education: N/A   Occupational History  . Nurse [employer]  Social History Main Topics  . Smoking status: Never Smoker  . Smokeless tobacco: Never Used  . Alcohol use 0.0 oz/week     Comment: socially  . Drug use: No  . Sexual activity: Yes    Partners: Male    Birth control/ protection: None   Other Topics Concern  . None   Social History Narrative  . None    Family History: Family History  Problem Relation Age of Onset  . Osteoarthritis Mother   . Hypertension Father   . Atrial fibrillation Father   . Hyperlipidemia Father     Allergies: No Known Allergies  Prescriptions Prior to Admission  Medication Sig Dispense Refill Last Dose  . aspirin (ASPIRIN CHILDRENS) 81 MG chewable tablet Chew 1 tablet (81 mg total) by mouth daily. 100 tablet 12 Past Month at Unknown time  . calcium carbonate (TUMS - DOSED IN MG ELEMENTAL CALCIUM) 500 MG chewable tablet Chew 1 tablet by mouth daily as needed for indigestion or heartburn.     . Misc. Devices KIT Disp 1 Spectra S2 Double breast pump 1 each 0 Taking  . Prenatal Vit-Sel-Fe Fum-FA (VINATE M) 27-1  MG TABS Take 1 tablet by mouth daily.    Taking     Review of Systems   All systems reviewed and negative except as stated in HPI  Blood pressure 133/85, pulse 68, temperature 98.2 F (36.8 C), temperature source Oral, resp. rate 20, height 5' 7"  (1.702 m), weight 255 lb (115.7 kg), last menstrual period 09/12/2015. General appearance: alert, cooperative, appears stated age and no distress Lungs: clear to auscultation bilaterally Heart: regular rate and rhythm Abdomen: soft, non-tender; bowel sounds normal Extremities: No calf swelling or tenderness Presentation: cephalic Fetal monitoring: 130, mod var, +accels, no decels Uterine activity: Occasional, not feeling     Prenatal labs: ABO, Rh: --/--/O POS (09/02 0720) Antibody: NEG (09/02 0720) Rubella: !Error! RPR: NON REAC (06/15 1646)  HBsAg: NEGATIVE (01/26 1409)  HIV: NONREACTIVE (06/15 1646)  GBS:   POS 1 hr Glucola: 129 Genetic screening:   cfDNA NEG Anatomy [place] Girl, normal  Prenatal Transfer Tool  Maternal Diabetes: No Genetic Screening: Normal Maternal Ultrasounds/Referrals: Normal Fetal Ultrasounds or other Referrals:  None Maternal Substance Abuse:  No Significant Maternal Medications:  None Significant Maternal Lab Results: None  Results for orders placed or performed during the hospital encounter of 06/16/16 (from the past 24 hour(s))  Type  and screen   Collection Time: 06/16/16  7:20 AM  Result Value Ref Range   ABO/RH(D) O POS    Antibody Screen NEG    Sample Expiration 06/19/2016     Patient Active Problem List   Diagnosis Date Noted  . Decreased fetal movement in pregnancy in third trimester, antepartum 06/14/2016  . Previous cesarean delivery, antepartum condition or complication 51/83/3582  . AMA (advanced maternal age) multigravida 35+ 11/10/2015  . Supervision of elderly multigravida, antepartum 11/10/2015  . History of pre-eclampsia in prior pregnancy, currently pregnant 11/10/2015     Assessment: Savannah Nixon is a 40 y.o. P1G9842 at 58w0dhere for scheduled repeat LTCS with BTL  #Labor: Scheduled C/S #Pain: Spinal #FWB: Cat I #ID:  GBS+ #MOF: Breast #MOC:BTL #Circ:  N/A - girl  EKatherine Basset DO 06/16/2016, 8:49 AM

## 2016-06-16 NOTE — Addendum Note (Signed)
Addendum  created 06/16/16 1539 by Renford DillsJanet L Demetric Parslow, CRNA   Sign clinical note

## 2016-06-17 LAB — RPR: RPR: NONREACTIVE

## 2016-06-17 LAB — CBC
HCT: 26.4 % — ABNORMAL LOW (ref 36.0–46.0)
HEMOGLOBIN: 9.3 g/dL — AB (ref 12.0–15.0)
MCH: 31.4 pg (ref 26.0–34.0)
MCHC: 35.2 g/dL (ref 30.0–36.0)
MCV: 89.2 fL (ref 78.0–100.0)
Platelets: 161 10*3/uL (ref 150–400)
RBC: 2.96 MIL/uL — AB (ref 3.87–5.11)
RDW: 13.3 % (ref 11.5–15.5)
WBC: 12.8 10*3/uL — ABNORMAL HIGH (ref 4.0–10.5)

## 2016-06-17 NOTE — Progress Notes (Signed)
Patient ID: Savannah IbaKaren R Yellen, female   DOB: 01/19/1976, 40 y.o.   MRN: [id number]  POSTPARTUM PROGRESS NOTE  Post Op Day #1 Subjective:  Savannah Nixon is a 40 y.o. [id number] 5425w0d s/p RLTCS with BTL.  No acute events overnight.  Pt denies problems with ambulating, voiding or po intake.  She denies nausea or vomiting.  Pain is well controlled.  She has had flatus. She has not had bowel movement.  Lochia Small.   Objective: Blood pressure 126/77, pulse 63, temperature 98 F (36.7 C), temperature source Oral, resp. rate 18, height 5\' 7"  (1.702 m), weight 255 lb (115.7 kg), last menstrual period 09/12/2015, SpO2 98 %, unknown if currently breastfeeding.  Physical Exam:  General: alert, cooperative and no distress Lochia:normal flow Chest: CTAB Heart: RRR no m/r/g Abdomen: +BS, soft, nontender,  Uterine Fundus: firm, below umbilicus DVT Evaluation: No calf swelling or tenderness Extremities: Trace edema   Recent Labs  06/16/16 0720 06/17/16 0517  HGB 10.7* 9.3*  HCT 31.1* 26.4*    Assessment/Plan:  ASSESSMENT: Savannah IbaKaren R Cristiano is a 40 y.o. [id number] 3625w0d s/p RLTCS with BTL  Plan for discharge tomorrow, Breastfeeding and Contraception BTL   LOS: 1 day   Jen MowElizabeth Nagee Goates, DO 06/17/2016, 9:10 AM

## 2016-06-18 ENCOUNTER — Encounter (HOSPITAL_COMMUNITY): Payer: Self-pay | Admitting: Obstetrics & Gynecology

## 2016-06-18 DIAGNOSIS — Z9851 Tubal ligation status: Secondary | ICD-10-CM

## 2016-06-18 MED ORDER — SENNOSIDES-DOCUSATE SODIUM 8.6-50 MG PO TABS: 2.0000 | ORAL_TABLET | ORAL | 0 refills | Status: AC

## 2016-06-18 MED ORDER — IBUPROFEN 600 MG PO TABS: 600.0000 mg | ORAL_TABLET | Freq: Four times a day (QID) | ORAL | 0 refills | Status: DC

## 2016-06-18 MED ORDER — OXYCODONE-ACETAMINOPHEN 5-325 MG PO TABS: 1.0000 | ORAL_TABLET | ORAL | 0 refills | Status: DC | PRN

## 2016-06-18 NOTE — Discharge Summary (Signed)
OB Discharge Summary     Patient Name: Savannah Nixon DOB: 10-Sep-1976 MRN: [id number]  Date of admission: 06/16/2016 Delivering MD: Woodroe Mode   Date of discharge: 06/18/2016  Admitting diagnosis: C SECTION PREV CS;DESIRE STERILIZATION Intrauterine pregnancy: [redacted]w[redacted]d    Secondary diagnosis:  Active Problems:   Supervision of elderly multigravida, antepartum   Previous cesarean delivery, antepartum condition or complication   S/P cesarean section   S/P tubal ligation  Additional problems: none     Discharge diagnosis: Term Pregnancy Delivered                                                                                                Post partum procedures:postpartum tubal ligation  Augmentation: AROM  Complications: None  Hospital course:  Sceduled C/S   40y.o. yo [id number] 39w0das admitted to the hospital 06/16/2016 for scheduled cesarean section with the following indication:Elective Repeat.  Membrane Rupture Time/Date: 10:04 AM ,06/16/2016   Patient delivered a Viable infant.06/16/2016  Details of operation can be found in separate operative note.  Pateint had an uncomplicated postpartum course.  She is ambulating, tolerating a regular diet, passing flatus, and urinating well. Patient is discharged home in stable condition on  06/18/16          Physical exam  Vitals:   06/17/16 0522 06/17/16 0950 06/17/16 1900 06/18/16 0605  BP: 126/77 128/66 136/80 136/71  Pulse: 63 64 61 (!) 53  Resp: _0 Temp: 98 F (36.7 C) 98 F (36.7 C) 98.4 F (36.9 C) 97.6 F (36.4 C)  TempSrc: Oral Oral Oral Oral  SpO2: 98%  99%   Weight:      Height:       General: alert, cooperative and no distress Lochia: appropriate Uterine Fundus: firm Incision: Healing well with no significant drainage, No significant erythema, Dressing is clean, dry, and intact DVT Evaluation: No evidence of DVT seen on physical exam. Negative Homan's sign. Labs: Lab Results  Component Value  Date   WBC 12.8 (H) 06/17/2016   HGB 9.3 (L) 06/17/2016   HCT 26.4 (L) 06/17/2016   MCV 89.2 06/17/2016   PLT 161 06/17/2016   CMP Latest Ref Rng & Units 11/10/2015  Glucose 65 - 99 mg/dL 73  BUN 7 - 25 mg/dL 10  Creatinine 0.50 - 1.10 mg/dL 0.71  Sodium 135 - 146 mmol/L 136  Potassium 3.5 - 5.3 mmol/L 3.8  Chloride 98 - 110 mmol/L 104  CO2 20 - 31 mmol/L 22  Calcium 8.6 - 10.2 mg/dL 9.5  Total Protein 6.1 - 8.1 g/dL 6.9  Total Bilirubin 0.2 - 1.2 mg/dL 0.3  Alkaline Phos 33 - 115 U/L 43  AST 10 - 30 U/L 14  ALT 6 - 29 U/L 12    Discharge instruction: per After Visit Summary and "Baby and Me Booklet".  After visit meds:    Medication List    TAKE these medications   calcium carbonate 500 MG chewable tablet Commonly known as:  TUMS - dosed in mg elemental calcium Chew 1 tablet by  mouth daily as needed for indigestion or heartburn.   ibuprofen 600 MG tablet Commonly known as:  ADVIL,MOTRIN Take 1 tablet (600 mg total) by mouth every 6 (six) hours.   Misc. Devices Kit Disp 1 Spectra S2 Double breast pump   oxyCODONE-acetaminophen 5-325 MG tablet Commonly known as:  PERCOCET/ROXICET Take 1 tablet by mouth every 4 (four) hours as needed (pain scale 4-7).   senna-docusate 8.6-50 MG tablet Commonly known as:  Senokot-S Take 2 tablets by mouth daily.   VINATE M 27-1 MG Tabs Take 1 tablet by mouth daily.       Diet: routine diet  Activity: Advance as tolerated. Pelvic rest for 6 weeks.   Outpatient follow up:6 weeks Follow up Appt: Future Appointments Date Time Provider [place]  07/30/2016 10:30 AM Manya Silvas, [facility]   Follow up Visit: [place] for [place] [facility] [place] Follow up in 6 week(s).   Specialty:  Obstetrics and Gynecology Why:  postpartum visit Contact information: [place], [place] [phone]          Postpartum  contraception: s/p Tubal Ligation  Newborn Data: Live born female  Birth Weight: 8 lb 12.7 oz (3990 g) APGAR: 10, 10  Baby Feeding: Breast Disposition:home with mother   06/18/2016 Bufford Lope, DO PGY-1  CNM attestation I have seen and examined this patient and agree with above documentation in the resident's note.   Savannah Nixon is a 40 y.o. [phone] s/p rLTCS and BTL.   Pain is well controlled.  Plan for birth control is bilateral tubal ligation.  Method of Feeding: breast  PE:  BP 136/71   Pulse (!) 53   Temp 97.6 F (36.4 C) (Oral)   Resp 16   Ht _0  (1.702 m)   Wt 115.7 kg (255 lb)   LMP 09/12/2015   SpO2 99%   Breastfeeding? Unknown   BMI 39.94 kg/m  Fundus firm   Recent Labs  06/16/16 0720 06/17/16 0517  HGB 10.7* 9.3*  HCT 31.1* 26.4*     Plan: discharge today - postpartum care discussed - f/u clinic in 6 weeks for postpartum visit   Serita Grammes, CNM 10:59 AM  06/18/2016

## 2016-06-18 NOTE — Lactation Note (Signed)
This note was copied from a baby's chart. Lactation Consultation Note  Patient Name: Savannah Nixon ZOXWR'UToday's Date: 06/18/2016 Reason for consult: Follow-up assessment Baby 52 hours old. Mom reports that baby is nursing well. Mom states that she pumped about 5 ml last night just to see if her milk was flowing, and then gave this to the baby. Mom reports that she is seeing good swallows while baby at breast. Enc mom to call out for assistance as needed with BF.   Maternal Data    Feeding Feeding Type: Breast Fed Length of feed: 15 min  LATCH Score/Interventions                      Lactation Tools Discussed/Used     Consult Status Consult Status: Follow-up Date: 06/19/16 Follow-up type: In-patient    Sherlyn HayJennifer D Alvan Culpepper 06/18/2016, 2:08 PM

## 2016-06-19 LAB — COMPREHENSIVE METABOLIC PANEL
ALK PHOS: 185 U/L — AB (ref 38–126)
ALT: 27 U/L (ref 14–54)
AST: 48 U/L — ABNORMAL HIGH (ref 15–41)
Albumin: 2.8 g/dL — ABNORMAL LOW (ref 3.5–5.0)
Anion gap: 9 (ref 5–15)
BUN: 10 mg/dL (ref 6–20)
CALCIUM: 9.3 mg/dL (ref 8.9–10.3)
CO2: 23 mmol/L (ref 22–32)
CREATININE: 0.82 mg/dL (ref 0.44–1.00)
Chloride: 106 mmol/L (ref 101–111)
Glucose, Bld: 108 mg/dL — ABNORMAL HIGH (ref 65–99)
Potassium: 2.8 mmol/L — ABNORMAL LOW (ref 3.5–5.1)
Sodium: 138 mmol/L (ref 135–145)
Total Bilirubin: 0.4 mg/dL (ref 0.3–1.2)
Total Protein: 5.8 g/dL — ABNORMAL LOW (ref 6.5–8.1)

## 2016-06-19 LAB — PROTEIN / CREATININE RATIO, URINE
CREATININE, URINE: 32 mg/dL
Total Protein, Urine: <6 mg/dL

## 2016-06-19 LAB — CBC
HCT: 30 % — ABNORMAL LOW (ref 36.0–46.0)
Hemoglobin: 10.2 g/dL — ABNORMAL LOW (ref 12.0–15.0)
MCH: 31.6 pg (ref 26.0–34.0)
MCHC: 34 g/dL (ref 30.0–36.0)
MCV: 92.9 fL (ref 78.0–100.0)
PLATELETS: 193 10*3/uL (ref 150–400)
RBC: 3.23 MIL/uL — AB (ref 3.87–5.11)
RDW: 13.1 % (ref 11.5–15.5)
WBC: 10.8 10*3/uL — ABNORMAL HIGH (ref 4.0–10.5)

## 2016-06-19 MED ORDER — POTASSIUM CHLORIDE CRYS ER 20 MEQ PO TBCR
40.0000 meq | EXTENDED_RELEASE_TABLET | Freq: Two times a day (BID) | ORAL | Status: DC
  Administered 2016-06-19 (×2): 40 meq via ORAL
  Filled 2016-06-19 (×3): qty 2

## 2016-06-19 MED ORDER — LISINOPRIL 20 MG PO TABS
20.0000 mg | ORAL_TABLET | Freq: Every day | ORAL | Status: DC
  Administered 2016-06-19 – 2016-06-20 (×2): 20 mg via ORAL
  Filled 2016-06-19 (×3): qty 1

## 2016-06-19 MED ORDER — HYDROCHLOROTHIAZIDE 25 MG PO TABS
25.0000 mg | ORAL_TABLET | Freq: Every day | ORAL | Status: DC
  Administered 2016-06-19 – 2016-06-20 (×2): 25 mg via ORAL
  Filled 2016-06-19 (×3): qty 1

## 2016-06-19 NOTE — Progress Notes (Signed)
POSTPARTUM PROGRESS NOTE  Post Partum Day 3  Subjective:  Savannah Nixon is a 40 y.o. O9G2952G4P2112 [redacted]w[redacted]d s/p RLTCS with BTL. She reports swelling of bilateral legs, starting last night. She notes that she had h/o PreE with first pregnancy, and that with lat pregnancy, she had significant swelling postpartum and was having BP up to 190s systolic at home. Denies any headache, visual disturbance, or RUQ.  Pt denies problems with ambulating, voiding or po intake.  She denies nausea or vomiting.  Pain is well controlled.  She has had flatus.  Lochia Minimal.   Objective: Blood pressure (!) 151/73, pulse 65, temperature 97.5 F (36.4 C), temperature source Oral, resp. rate 18, height 5\' 7"  (1.702 m), weight 255 lb (115.7 kg), last menstrual period 09/12/2015, SpO2 99 %, unknown if currently breastfeeding.  Physical Exam:  General: alert, cooperative and no distress Lochia: normal flow Chest: no respiratory distress. Lungs CTAB Heart: Normal rate, regular rhythm, distal pulses intact Abdomen: soft, nontender Uterine Fundus: firm, appropriately tender DVT Evaluation: No calf swelling or tenderness Extremities: 1+ bilateral LE edema   Recent Labs  06/17/16 0517 06/19/16 0809  HGB 9.3* 10.2*  HCT 26.4* 30.0*    Assessment/Plan:  ASSESSMENT: Savannah Nixon is a 40 y.o. [id number] 535w0d s/p RLTCS with BTL, PPD#3.  Having LE edema, and BP elevated to 150s/80s since last night.   - Will get PreE/HELLP labs: CBC, CMP, urine Pr:Cr - Start HCTZ 25 mg daily - Hold off on d/c today   LOS: 3 days   Kandra NicolasJulie P DegeleMD 06/19/2016, 1:06 PM    OB FELLOW POSTPARTUM PROGRESS NOTE ATTESTATION  I have seen and examined this patient and agree with above documentation in the resident's note.   Ernestina PennaNicholas Schenk, MD 5:28 PM

## 2016-06-19 NOTE — Lactation Note (Signed)
This note was copied from a baby's chart. Lactation Consultation Note  Patient Name: Girl Martyn EhrichKaren Deblasi ZOXWR'UToday's Date: 06/19/2016 Reason for consult: Follow-up assessment;Hyperbilirubinemia;Infant weight loss  Baby 72 hours old. Mom reports that baby sleepy at breast. Discussed how hyperbilirubinemia and phototherapy lights contribute to sleepiness. Mom able to latch baby to right breast in modified cross-cradle position. Baby latched deeply and suckled rhythmically with intermittent swallows noted. During the 10 minute BF, baby gradually slipped to tip of nipple and mom's nipple pinched when baby removed from breast. Mom uncomfortable in the bed, so assisted mom to move with baby and light to chair. Assisted mom to position baby and baby latched in football position to left breast deeply and able to maintain a deep latch. Enc mom to hand express and syringe/finger feed as well after baby at breast because baby so sleepy at breast. FOB had dropped syringe on floor, so provided parents with curve-tipped syringe, colostrum container and volu-feed to express into as needed. Parents stated that they want to supplement this way. Enc parents to call out for assistance as needed.   Discussed keeping baby latched on deeply in order to prevent sore nipples. Mom has coconut oil at bedside, and enc to use EBM on nipples. Maternal Data    Feeding Feeding Type: Breast Fed Length of feed: 10 min  LATCH Score/Interventions Latch: Grasps breast easily, tongue down, lips flanged, rhythmical sucking. Intervention(s): Adjust position;Assist with latch  Audible Swallowing: Spontaneous and intermittent  Type of Nipple: Everted at rest and after stimulation  Comfort (Breast/Nipple): Soft / non-tender     Hold (Positioning): Assistance needed to correctly position infant at breast and maintain latch. (maneuvering around light)  LATCH Score: 9  Lactation Tools Discussed/Used     Consult Status Consult  Status: Follow-up Date: 06/20/16 Follow-up type: In-patient    Sherlyn HayJennifer D Jalaine Riggenbach 06/19/2016, 10:48 AM

## 2016-06-20 LAB — POTASSIUM: Potassium: 3.3 mmol/L — ABNORMAL LOW (ref 3.5–5.1)

## 2016-06-20 MED ORDER — LISINOPRIL 40 MG PO TABS
40.0000 mg | ORAL_TABLET | Freq: Every day | ORAL | 1 refills | Status: AC
Start: 2016-06-20 — End: ?

## 2016-06-20 MED ORDER — OXYCODONE-ACETAMINOPHEN 5-325 MG PO TABS: 1.0000 | ORAL_TABLET | ORAL | 0 refills | Status: AC | PRN

## 2016-06-20 MED ORDER — IBUPROFEN 600 MG PO TABS: 600.0000 mg | ORAL_TABLET | Freq: Four times a day (QID) | ORAL | 0 refills | Status: AC

## 2016-06-20 MED ORDER — LISINOPRIL 20 MG PO TABS
20.0000 mg | ORAL_TABLET | Freq: Once | ORAL | Status: AC
  Administered 2016-06-20: 20 mg via ORAL
  Filled 2016-06-20: qty 1

## 2016-06-20 MED ORDER — LISINOPRIL 40 MG PO TABS: 40.0000 mg | ORAL_TABLET | Freq: Every day | ORAL | 1 refills | Status: DC

## 2016-06-20 MED ORDER — POTASSIUM CHLORIDE CRYS ER 20 MEQ PO TBCR
40.0000 meq | EXTENDED_RELEASE_TABLET | Freq: Two times a day (BID) | ORAL | Status: DC
  Administered 2016-06-20: 40 meq via ORAL
  Filled 2016-06-20 (×2): qty 2

## 2016-06-20 MED ORDER — HYDROCHLOROTHIAZIDE 25 MG PO TABS: 25.0000 mg | ORAL_TABLET | Freq: Every day | ORAL | 1 refills | Status: DC

## 2016-06-20 NOTE — Discharge Instructions (Signed)
Elevated Blood Pressures Monitor your blood pressure at home if you have a home monitor. Call you doctor if blood pressure is greater than 160/110   Cesarean Delivery, Care After Refer to this sheet in the next few weeks. These instructions provide you with information on caring for yourself after your procedure. Your health care provider may also give you specific instructions. Your treatment has been planned according to current medical practices, but problems sometimes occur. Call your health care provider if you have any problems or questions after you go home. HOME CARE INSTRUCTIONS  Only take over-the-counter or prescription medications as directed by your health care provider.  Do not drink alcohol, especially if you are breastfeeding or taking medication to relieve pain.  Do not chew or smoke tobacco.  Continue to use good perineal care. Good perineal care includes:  Wiping your perineum from front to back.  Keeping your perineum clean.  Check your surgical cut (incision) daily for increased redness, drainage, swelling, or separation of skin.  Clean your incision gently with soap and water every day, and then pat it dry. If your health care provider says it is okay, leave the incision uncovered. Use a bandage (dressing) if the incision is draining fluid or appears irritated. If the adhesive strips across the incision do not fall off within 7 days, carefully peel them off.  Hug a pillow when coughing or sneezing until your incision is healed. This helps to relieve pain.  Do not use tampons or douche until your health care provider says it is okay.  Shower, wash your hair, and take tub baths as directed by your health care provider.  Wear a well-fitting bra that provides breast support.  Limit wearing support panties or control-top hose.  Drink enough fluids to keep your urine clear or pale yellow.  Eat high-fiber foods such as whole grain cereals and breads, brown rice,  beans, and fresh fruits and vegetables every day. These foods may help prevent or relieve constipation.  Resume activities such as climbing stairs, driving, lifting, exercising, or traveling as directed by your health care provider.  Talk to your health care provider about resuming sexual activities. This is dependent upon your risk of infection, your rate of healing, and your comfort and desire to resume sexual activity.  Try to have someone help you with your household activities and your newborn for at least a few days after you leave the hospital.  Rest as much as possible. Try to rest or take a nap when your newborn is sleeping.  Increase your activities gradually.  Keep all of your scheduled postpartum appointments. It is very important to keep your scheduled follow-up appointments. At these appointments, your health care provider will be checking to make sure that you are healing physically and emotionally. SEEK MEDICAL CARE IF:   You are passing large clots from your vagina. Save any clots to show your health care provider.  You have a foul smelling discharge from your vagina.  You have trouble urinating.  You are urinating frequently.  You have pain when you urinate.  You have a change in your bowel movements.  You have increasing redness, pain, or swelling near your incision.  You have pus draining from your incision.  Your incision is separating.  You have painful, hard, or reddened breasts.  You have a severe headache.  You have blurred vision or see spots.  You feel sad or depressed.  You have thoughts of hurting yourself or your newborn.  You have questions about your care, the care of your newborn, or medications.  You are dizzy or light-headed.  You have a rash.  You have pain, redness, or swelling at the site of the removed intravenous access (IV) tube.  You have nausea or vomiting.  You stopped breastfeeding and have not had a menstrual period  within 12 weeks of stopping.  You are not breastfeeding and have not had a menstrual period within 12 weeks of delivery.  You have a fever. SEEK IMMEDIATE MEDICAL CARE IF:  You have persistent pain.  You have chest pain.  You have shortness of breath.  You faint.  You have leg pain.  You have stomach pain.  Your vaginal bleeding saturates 2 or more sanitary pads in 1 hour. MAKE SURE YOU:   Understand these instructions.  Will watch your condition.  Will get help right away if you are not doing well or get worse.   This information is not intended to replace advice given to you by your health care provider. Make sure you discuss any questions you have with your health care provider.   Document Released: 06/23/2002 Document Revised: 10/22/2014 Document Reviewed: 05/28/2012 Elsevier Interactive Patient Education [employer]

## 2016-06-20 NOTE — Progress Notes (Signed)
Pt instructed to check blood pressure 3-4 times a day and follow up on Friday with her provider.  Medications amounts and times discussed with patient.  Pt educated on signs and symptoms of increased blood pressure and reasons to call the MD.  Pt verbalizes understanding.

## 2016-06-20 NOTE — Discharge Summary (Signed)
OB Discharge Summary     Patient Name: Savannah Nixon DOB: 03-30-1976 MRN: [id number]  Date of admission: 06/16/2016 Delivering MD: Woodroe Mode   Date of discharge: 06/20/2016  Admitting diagnosis: C SECTION PREV CS;DESIRE STERILIZATION Intrauterine pregnancy: [redacted]w[redacted]d    Secondary diagnosis:  Active Problems:   Supervision of elderly multigravida, antepartum   Previous cesarean delivery, antepartum condition or complication   S/P cesarean section   S/P tubal ligation  Additional problems: Elevated Blood pressures postpartum      Discharge diagnosis: Term Pregnancy Delivered                                                                                                Post partum procedures:none  Augmentation: none  Complications: None  Hospital course:  Sceduled C/S   40y.o. yo [id number] admitted to the hospital 06/16/2016 for scheduled cesarean section with the following indication:Elective Repeat. She underwent RLTCS and tubal ligation.  Membrane Rupture Time/Date: 10:04 AM ,06/16/2016   Patient delivered a Viable infant.06/16/2016  Details of operation can be found in separate operative note.  Postpartum course was complicated by elevated blood pressures. PreE/HELLP labs were normal. She was started on HCTZ, and lisinopril was later added. On day of discharge, BP were in the mild ranges. Only one severe-rage pressure recorded, but improved on recheck. She was discharged home on HCTZ 2527mnd lisinopril 40 mg daily.  She is ambulating, tolerating a regular diet, passing flatus, and urinating well. Patient is discharged home in stable condition on  06/20/16         Physical exam Vitals:   06/20/16 1142 06/20/16 1441 06/20/16 1457 06/20/16 1458  BP: (!) 148/82 (!) 162/87 (!) 154/84 (!) 143/78  Pulse:  98    Resp: 20 18    Temp: 97.8 F (36.6 C) 98.4 F (36.9 C)    TempSrc: Oral Oral    SpO2:      Weight:      Height:       General: alert, cooperative and no  distress  Lungs: CTAB Heart: RRR Lochia: appropriate Uterine Fundus: firm Incision: Dressing is clean, dry, and intact DVT Evaluation: No evidence of DVT seen on physical exam. Negative Homan's sign. No cords or calf tenderness.  Labs: Lab Results  Component Value Date   WBC 10.8 (H) 06/19/2016   HGB 10.2 (L) 06/19/2016   HCT 30.0 (L) 06/19/2016   MCV 92.9 06/19/2016   PLT 193 06/19/2016   CMP Latest Ref Rng & Units 06/20/2016  Glucose 65 - 99 mg/dL -  BUN 6 - 20 mg/dL -  Creatinine 0.44 - 1.00 mg/dL -  Sodium 135 - 145 mmol/L -  Potassium 3.5 - 5.1 mmol/L 3.3(L)  Chloride 101 - 111 mmol/L -  CO2 22 - 32 mmol/L -  Calcium 8.9 - 10.3 mg/dL -  Total Protein 6.5 - 8.1 g/dL -  Total Bilirubin 0.3 - 1.2 mg/dL -  Alkaline Phos 38 - 126 U/L -  AST 15 - 41 U/L -  ALT 14 - 54 U/L -  Discharge instruction: per After Visit Summary and "Baby and Me Booklet".  After visit meds:    Medication List    TAKE these medications   calcium carbonate 500 MG chewable tablet Commonly known as:  TUMS - dosed in mg elemental calcium Chew 1 tablet by mouth daily as needed for indigestion or heartburn.   hydrochlorothiazide 25 MG tablet Commonly known as:  HYDRODIURIL Take 1 tablet (25 mg total) by mouth daily.   ibuprofen 600 MG tablet Commonly known as:  ADVIL,MOTRIN Take 1 tablet (600 mg total) by mouth every 6 (six) hours.   lisinopril 40 MG tablet Commonly known as:  PRINIVIL,ZESTRIL Take 1 tablet (40 mg total) by mouth daily.   Misc. Devices Kit Disp 1 Spectra S2 Double breast pump   oxyCODONE-acetaminophen 5-325 MG tablet Commonly known as:  PERCOCET/ROXICET Take 1 tablet by mouth every 4 (four) hours as needed (pain scale 4-7).   senna-docusate 8.6-50 MG tablet Commonly known as:  Senokot-S Take 2 tablets by mouth daily.   VINATE M 27-1 MG Tabs Take 1 tablet by mouth daily.       Diet: routine diet  Activity: Advance as tolerated. Pelvic rest for 6 weeks.    Outpatient follow up: Blood pressure check in 2 days. Regular postpartum check in 6 weeks Follow up Appt: Future Appointments Date Time Provider [place]  07/30/2016 10:30 AM [facility]   Follow up Visit:No Follow-up on file.  Postpartum contraception: Tubal Ligation  Newborn Data: Live born female  Birth Weight: 8 lb 12.7 oz (3990 g) APGAR: 10, 10  Baby Feeding: Breast Disposition:home with mother   06/20/2016 Gailen Shelter, MD   OB FELLOW DISCHARGE ATTESTATION  I have seen and examined this patient and agree with above documentation in the resident's note.   Katherine Basset, DO OB Fellow

## 2016-06-20 NOTE — Lactation Note (Signed)
This note was copied from a baby's chart. Lactation Consultation Note: InIant is still on single photo therapy.. Mother states that infant is feeding well every 1-2 hours. She states that she is slightly sore and request comfort gel. Mother was given comfort gels and advised not to use coconut oil with gels.   Mother advised to continue to allow infant to cluster feeding. Mother 's milk in . She states that breast are filling full. Mother is aware of available lactation services and community support.   Patient Name: Savannah Martyn EhrichKaren Nixon Today's Date: 06/20/2016     Maternal Data    Feeding Feeding Type: Breast Fed Length of feed: 20 min  LATCH Score/Interventions Latch: Repeated attempts needed to sustain latch, nipple held in mouth throughout feeding, stimulation needed to elicit sucking reflex. Intervention(s): Adjust position;Assist with latch;Breast compression  Audible Swallowing: Spontaneous and intermittent Intervention(s): Skin to skin;Hand expression  Type of Nipple: Everted at rest and after stimulation  Comfort (Breast/Nipple): Soft / non-tender     Hold (Positioning): No assistance needed to correctly position infant at breast. Intervention(s): Support Pillows;Position options;Breastfeeding basics reviewed;Skin to skin  LATCH Score: 9  Lactation Tools Discussed/Used     Consult Status      Michel BickersKendrick, Kinda Pottle McCoy 06/20/2016, 10:13 AM

## 2016-06-22 ENCOUNTER — Ambulatory Visit (INDEPENDENT_AMBULATORY_CARE_PROVIDER_SITE_OTHER): Payer: BC Managed Care – PPO | Admitting: Family

## 2016-06-22 ENCOUNTER — Encounter: Payer: Self-pay | Admitting: Family

## 2016-06-22 VITALS — BP 166/90 | Wt 239.0 lb

## 2016-06-22 DIAGNOSIS — O135 Gestational [pregnancy-induced] hypertension without significant proteinuria, complicating the puerperium: Secondary | ICD-10-CM

## 2016-06-22 DIAGNOSIS — I158 Other secondary hypertension: Secondary | ICD-10-CM

## 2016-06-22 NOTE — Progress Notes (Signed)
Subjective:    Patient here for follow-up of elevated blood pressure.  Discharged home 06/20/16 status post csection.  Elevated blood pressure during postpartum period.  Discharge home on HCTZ 25 mg and Lisinopril daily.   Reports occasional headaches relieved with ibuprofen, none today.    The following portions of the patient's history were reviewed and updated as appropriate: allergies, current medications, past family history, past medical history, past social history, past surgical history and problem list.  Review of Systems Pertinent items are noted in HPI.     Objective:    BP (!) 166/90   Wt 239 lb (108.4 kg)   LMP 09/12/2015   Breastfeeding? Yes   BMI 37.43 kg/m  General appearance: alert, cooperative and appears stated age Head: Normocephalic, without obvious abnormality, atraumatic Lungs: clear to auscultation bilaterally Heart: regular rate and rhythm, S1, S2 normal, no murmur, click, rub or gallop    Assessment:    Elevated Blood Pressure  Plan:    Medication: increase to 50 mg daily of HCTZ.   Continue Lisinopril 40 mg daily Follow-up on Wednesday  Marlis EdelsonWalidah N Karim, CNM

## 2016-06-27 ENCOUNTER — Encounter: Payer: Self-pay | Admitting: Obstetrics & Gynecology

## 2016-06-27 ENCOUNTER — Ambulatory Visit (INDEPENDENT_AMBULATORY_CARE_PROVIDER_SITE_OTHER): Payer: BC Managed Care – PPO | Admitting: Obstetrics & Gynecology

## 2016-06-27 VITALS — BP 122/78 | HR 80 | Resp 16 | Ht 67.0 in | Wt 227.0 lb

## 2016-06-27 DIAGNOSIS — O1495 Unspecified pre-eclampsia, complicating the puerperium: Secondary | ICD-10-CM

## 2016-06-27 NOTE — Progress Notes (Signed)
Pt here for BP check.  Pt did not take her lisinopril today and BP is normal.  Pt is monitoring her BP at home and was nml last night.  Pt will stop lisinopril and continue to monitor BP.  If becomes elevated again, then will restart lisinopril.  Continue HCTZ.  Will start to ween that medication in 2 weeks.  Incision is well healed.  Vitals:   06/27/16 0946  BP: 122/78  Pulse: 80  Resp: 16  Weight: 227 lb (103 kg)  Height: 5\' 7"  (1.702 m)

## 2016-07-04 ENCOUNTER — Telehealth: Payer: Self-pay

## 2016-07-04 ENCOUNTER — Other Ambulatory Visit: Payer: Self-pay

## 2016-07-04 MED ORDER — HYDROCHLOROTHIAZIDE 25 MG PO TABS: 25.0000 mg | ORAL_TABLET | Freq: Every day | ORAL | 1 refills | Status: DC

## 2016-07-04 NOTE — Telephone Encounter (Signed)
Pt called asking us to refill her medication. We told her we could and then after reviewing the doctor's notes there was some confusion so I called pt back to ask her some questions and she didn't answer so I left a message asking her to call us back

## 2016-07-10 ENCOUNTER — Telehealth: Payer: Self-pay | Admitting: *Deleted

## 2016-07-10 MED ORDER — HYDROCHLOROTHIAZIDE 25 MG PO TABS: 25.0000 mg | ORAL_TABLET | Freq: Two times a day (BID) | ORAL | 1 refills | Status: DC

## 2016-07-10 NOTE — Telephone Encounter (Signed)
RF given for HCTZ 25 mg.  Pt to stay on med until post partum visit.

## 2016-07-30 ENCOUNTER — Ambulatory Visit: Payer: BC Managed Care – PPO | Admitting: Advanced Practice Midwife

## 2016-08-06 ENCOUNTER — Encounter: Payer: Self-pay | Admitting: Obstetrics & Gynecology

## 2016-08-06 ENCOUNTER — Ambulatory Visit (INDEPENDENT_AMBULATORY_CARE_PROVIDER_SITE_OTHER): Payer: BC Managed Care – PPO | Admitting: Obstetrics & Gynecology

## 2016-08-06 DIAGNOSIS — Z1151 Encounter for screening for human papillomavirus (HPV): Secondary | ICD-10-CM | POA: Diagnosis not present

## 2016-08-06 DIAGNOSIS — Z124 Encounter for screening for malignant neoplasm of cervix: Secondary | ICD-10-CM

## 2016-08-06 MED ORDER — HYDROCHLOROTHIAZIDE 25 MG PO TABS: 25.0000 mg | ORAL_TABLET | Freq: Two times a day (BID) | ORAL | 1 refills | Status: DC

## 2016-08-06 MED ORDER — HYDROCHLOROTHIAZIDE 25 MG PO TABS: 25.0000 mg | ORAL_TABLET | Freq: Two times a day (BID) | ORAL | 1 refills | Status: AC

## 2016-08-06 MED ORDER — VINATE M 27-1 MG PO TABS: 1.0000 | ORAL_TABLET | Freq: Every day | ORAL | 12 refills | Status: AC

## 2016-08-06 NOTE — Addendum Note (Signed)
Addended by: Granville LewisLARK, Drema Eddington L on: 08/06/2016 04:16 PM   Modules accepted: Orders

## 2016-08-06 NOTE — Progress Notes (Signed)
Subjective:     Savannah Nixon is a 40 y.o. female who presents for a postpartum visit. She is 6 weeks postpartum following a spontaneous vaginal delivery. I have fully reviewed the prenatal and intrapartum course. The delivery was at 39 gestational weeks. Outcome: repeat cesarean section, low transverse incision (third + BTL)  Anesthesia: spinal. Postpartum course has been normal. Baby's course has been normal. Baby is feeding by breast. Bleeding no bleeding. Bowel function is normal. Bladder function is normal. Patient is not sexually active. Contraception method is tubal ligation. Postpartum depression screening: negative.  The following portions of the patient's history were reviewed and updated as appropriate: allergies, current medications, past family history, past medical history, past social history, past surgical history and problem list.  Review of Systems Pertinent items are noted in HPI.   Objective:    BP 133/85   Pulse 64   Ht 5\' 7"  (1.702 m)   Wt 220 lb (99.8 kg)   Breastfeeding? Yes   BMI 34.46 kg/m   General:  alert   Breasts:  inspection negative, no nipple discharge or bleeding, no masses or nodularity palpable  Lungs: clear to auscultation bilaterally  Heart:  regular rate and rhythm, S1, S2 normal, no murmur, click, rub or gallop  Abdomen: soft, non-tender; bowel sounds normal; no masses,  no organomegaly, small rectus diathesis superior to the umbilicus, umbilical hernia   Vulva:  normal  Vagina: normal vagina  Cervix:  anteverted  Corpus: normal  Adnexa:  normal adnexa  Rectal Exam: Not performed.        Assessment:    Normal postpartum exam. Pap smear done at today's visit.   Plan:    1. Contraception: tubal ligation 2. She will follow up with her Internal Medicine MD, Dr. Nedra HaiLee in [place] for management of her BP meds. I have recommended weight loss for this issue. 3. Follow up in: 1 year or as needed.

## 2016-08-08 LAB — CYTOLOGY - PAP
Adequacy: ABSENT
DIAGNOSIS: NEGATIVE
HPV (WINDOPATH): NOT DETECTED

## 2016-09-04 ENCOUNTER — Encounter: Payer: Self-pay | Admitting: *Deleted

## 2017-02-24 IMAGING — US US MFM OB FOLLOW-UP
1 series · 14 of 28 positions shown · non-contrast
Comparison: none

[Series 1: us mfm ob follow-up · 54 acquisitions, 14 frames shown]
[im 2/54]
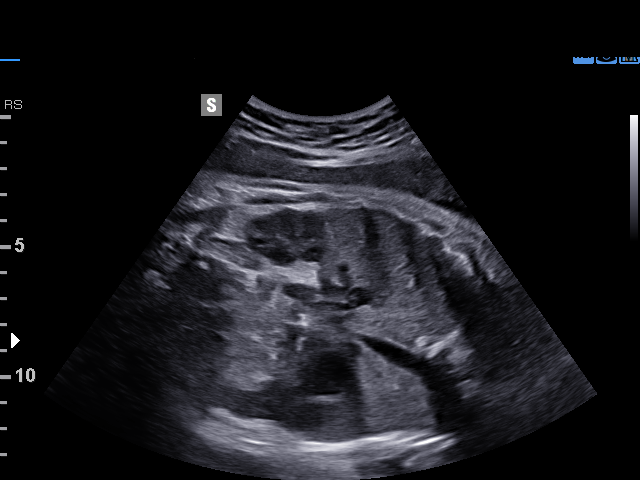
[im 6/54]
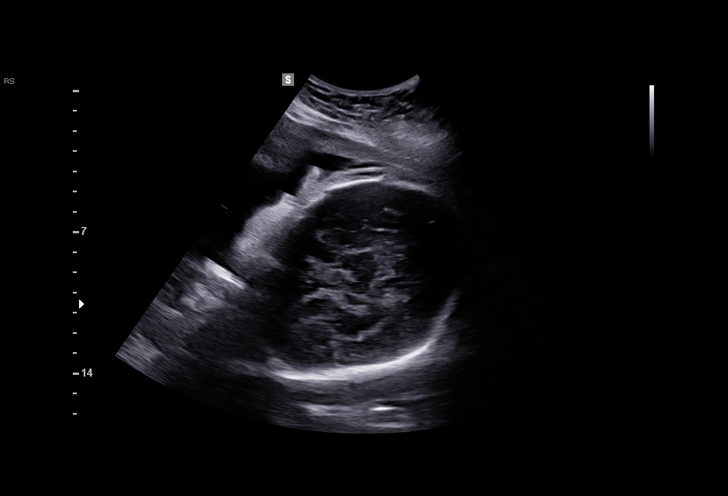
[im 10/54]
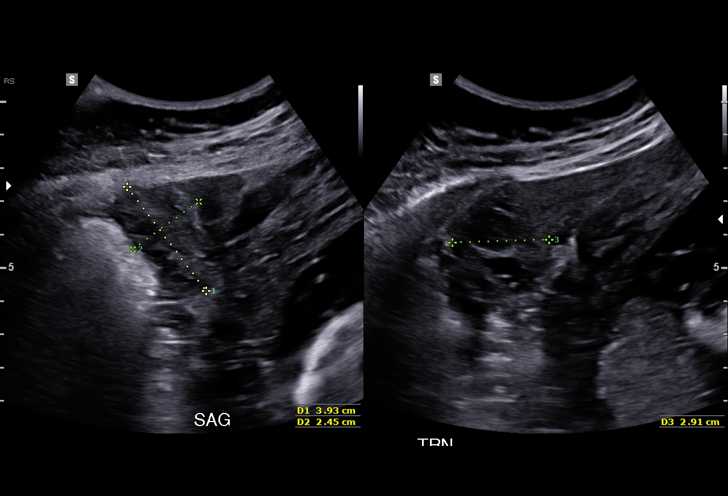
[im 14/54]
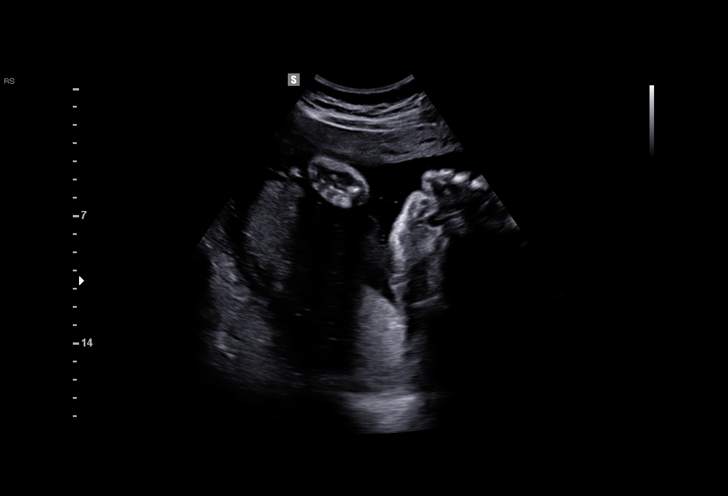
[im 18/54]
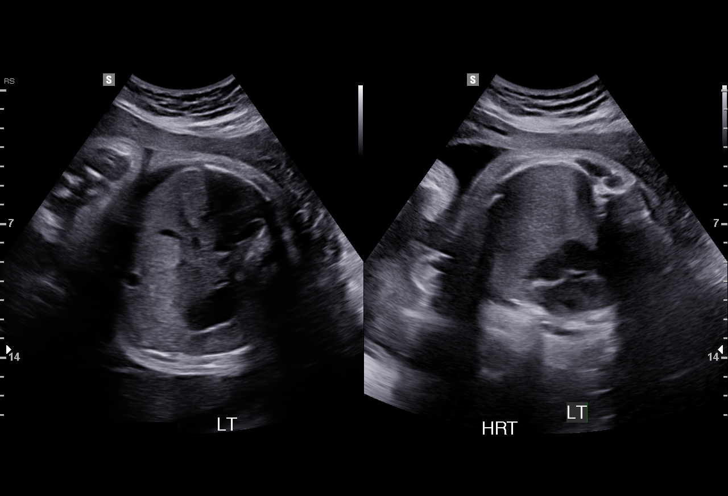
[im 22/54]
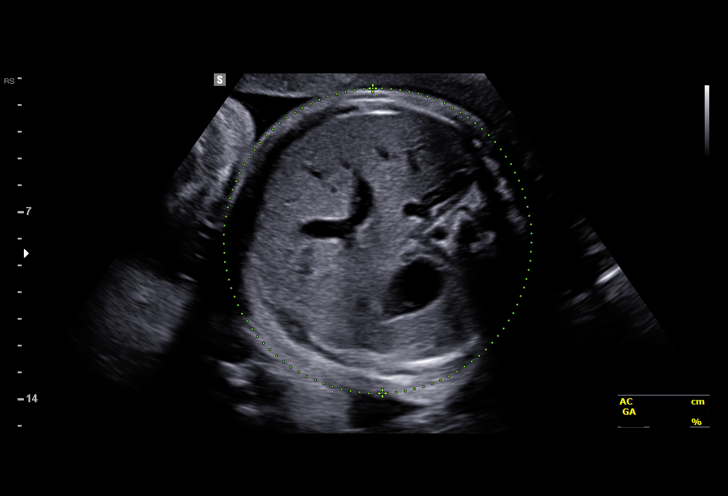
[im 26/54]
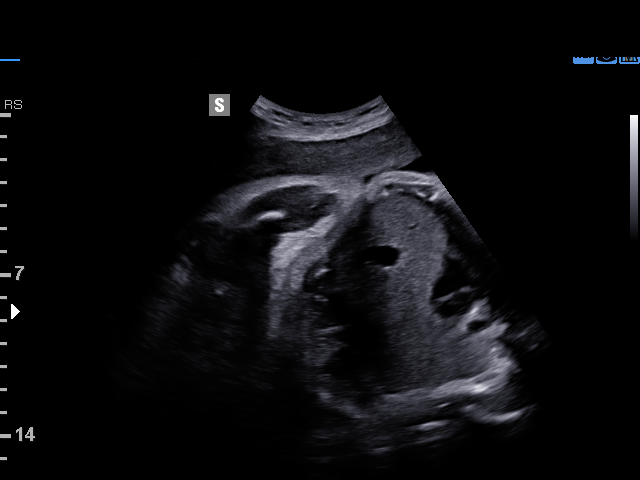
[im 30/54]
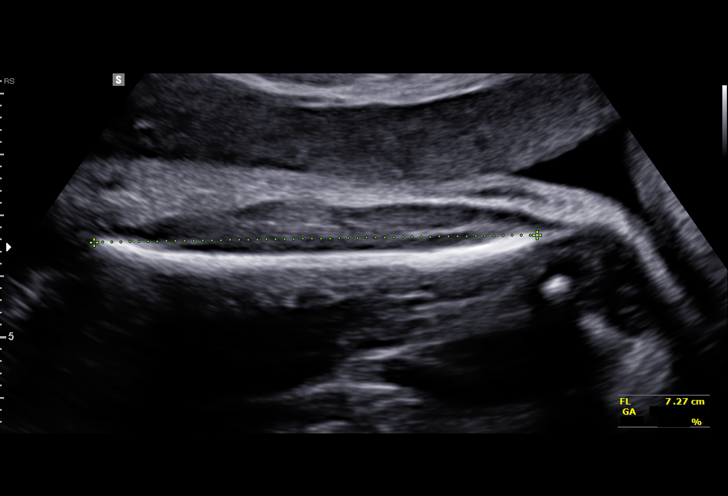
[im 34/54]
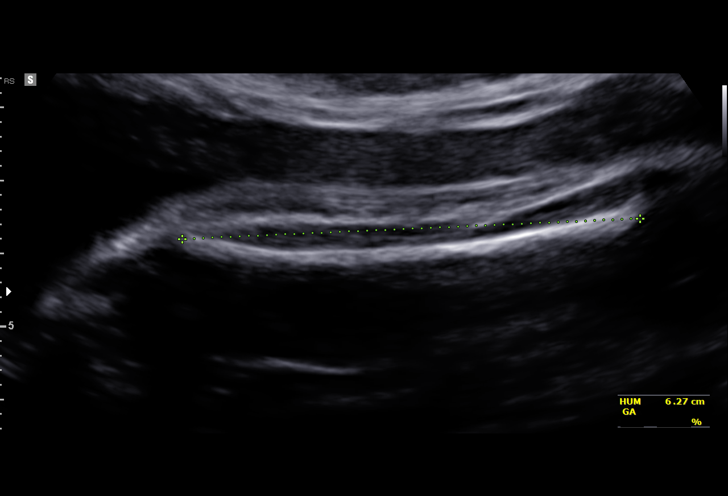
[im 38/54]
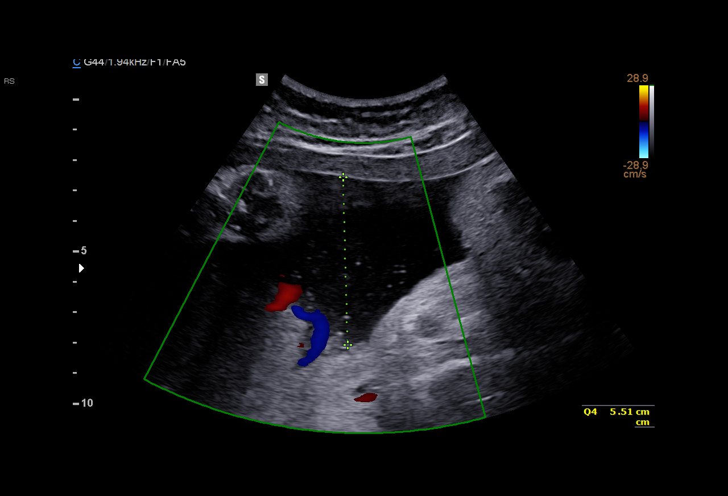
[im 42/54]
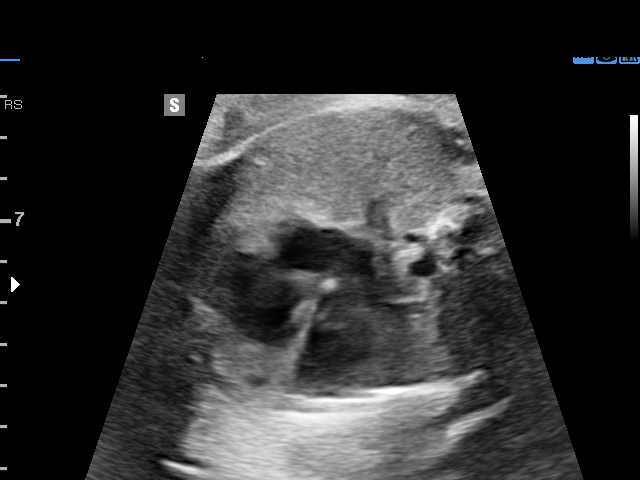
[im 46/54]
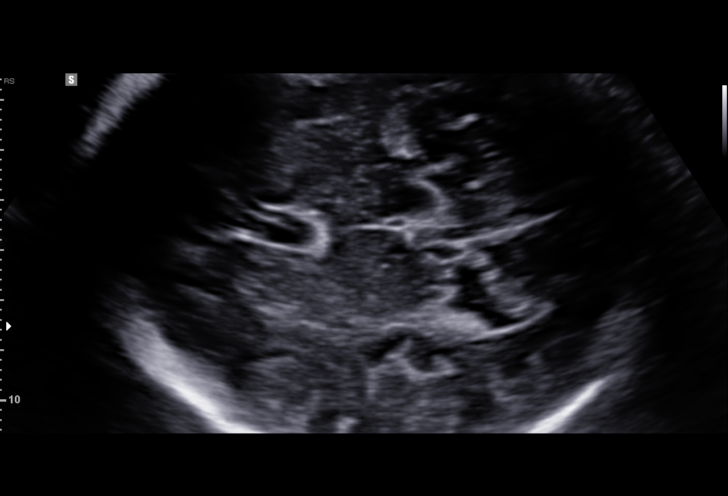
[im 50/54]
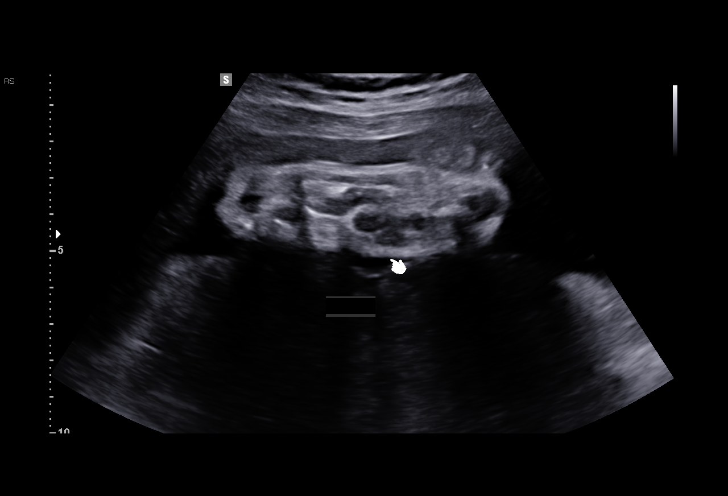
[im 54/54]
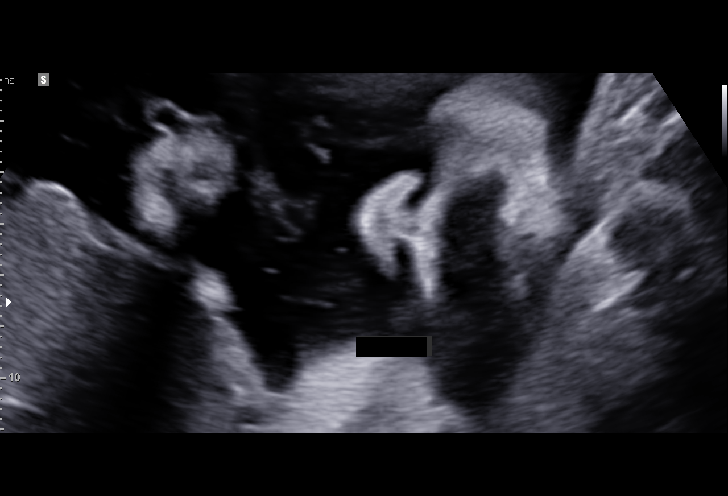

[14 of 28 positions shown; findings below may reference images not displayed]

[REDACTED]

1  SHURO NOMIYAMA            [id number]      [id number]     [id number]
Indications

36 weeks gestation of pregnancy
Poor obstetric history: Previous preeclampsia
Previous cesarean delivery, antepartum
Advanced maternal age multigravida 39,
third trimester (low risk NIPS)
OB History

Gravidity:    4         Term:   1        Prem:   1        SAB:   1
TOP:          0       Ectopic:  0        Living: 2
Fetal Evaluation

Num Of Fetuses:     1
Fetal Heart         144
Rate(bpm):
Cardiac Activity:   Observed
Presentation:       Cephalic
Placenta:           Posterior, above cervical os
P. Cord Insertion:  Previously Visualized

Amniotic Fluid
AFI FV:      Subjectively within normal limits

AFI Sum(cm)     %Tile       Largest Pocket(cm)
20.9            80
RUQ(cm)       RLQ(cm)       LUQ(cm)        LLQ(cm)
5.85
Biometry

BPD:      91.1  mm     G. Age:  37w 0d         73  %    CI:        68.61   %   70 - 86
FL/HC:      20.4   %   20.8 -
HC:      351.5  mm     G. Age:  41w 0d       > 97  %    HC/AC:      0.97       0.92 -
AC:      360.6  mm     G. Age:  40w 0d       > 97  %    FL/BPD:     78.8   %   71 - 87
FL:       71.8  mm     G. Age:  36w 6d         53  %    FL/AC:      19.9   %   20 - 24
HUM:      63.3  mm     G. Age:  36w 5d         69  %

Est. FW:    4994  gm      8 lb 1 oz   > 90  %
Gestational Age

LMP:           37w 2d       Date:   09/12/15                 EDD:   06/18/16
U/S Today:     38w 5d                                        EDD:   06/08/16
Best:          36w 4d    Det. By:   Early Ultrasound         EDD:   06/23/16
(11/10/15)
Anatomy

Cranium:               Appears normal         Aortic Arch:            Previously seen
Cavum:                 Previously seen        Ductal Arch:            Previously seen
Ventricles:            Appears normal         Diaphragm:              Previously seen
Choroid Plexus:        Previously seen        Stomach:                Appears normal, left
sided
Cerebellum:            Previously seen        Abdomen:                Previously seen
Posterior Fossa:       Previously seen        Abdominal Wall:         Previously seen
Nuchal Fold:           Not applicable (>20    Cord Vessels:           Previously seen
wks GA)
Face:                  Orbits and profile     Kidneys:                Appear normal
previously seen
Lips:                  Previously seen        Bladder:                Appears normal
Thoracic:              Appears normal         Spine:                  Previously seen
Heart:                 Previously seen        Upper Extremities:      Previously seen
RVOT:                  Previously seen        Lower Extremities:      Previously seen
LVOT:                  Previously seen

Other:  Female gender. Heels and 5th digit previously visualized. Technically
difficult due to advanced gestational age.
Cervix Uterus Adnexa

Cervix
Not visualized (advanced GA >53wks)

Uterus
No abnormality visualized.

Left Ovary
Within normal limits.

Right Ovary
Within normal limits.

Adnexa:       No abnormality visualized. No adnexal mass
visualized.
Impression

Singleton intrauterine pregnancy at 36 weeks 4 days
gestation with fetal cardiac activity
Cephalic presentation
Posterior placenta
Growth at >90th centile and AFI > 20 cm
Recommendations

Follow-up ultrasounds as clinically indicated.
